# Patient Record
Sex: Male | Born: 1960 | Race: Black or African American | Hispanic: No | Marital: Married | Smoking: Former smoker
Health system: Southern US, Community
[De-identification: ages and names within clinical notes are randomized; demographics above are authoritative.]

## PROBLEM LIST (undated history)

## (undated) DIAGNOSIS — Z973 Presence of spectacles and contact lenses: Secondary | ICD-10-CM

## (undated) DIAGNOSIS — E7800 Pure hypercholesterolemia, unspecified: Secondary | ICD-10-CM

## (undated) DIAGNOSIS — I10 Essential (primary) hypertension: Secondary | ICD-10-CM

## (undated) DIAGNOSIS — S83249A Other tear of medial meniscus, current injury, unspecified knee, initial encounter: Secondary | ICD-10-CM

## (undated) DIAGNOSIS — M1990 Unspecified osteoarthritis, unspecified site: Secondary | ICD-10-CM

## (undated) DIAGNOSIS — T8859XA Other complications of anesthesia, initial encounter: Secondary | ICD-10-CM

## (undated) DIAGNOSIS — T4145XA Adverse effect of unspecified anesthetic, initial encounter: Secondary | ICD-10-CM

## (undated) DIAGNOSIS — G4730 Sleep apnea, unspecified: Secondary | ICD-10-CM

## (undated) HISTORY — PX: COLONOSCOPY: SHX174

---

## 2002-01-30 ENCOUNTER — Ambulatory Visit (HOSPITAL_BASED_OUTPATIENT_CLINIC_OR_DEPARTMENT_OTHER): Admission: RE | Admit: 2002-01-30 | Discharge: 2002-01-30 | Payer: Self-pay | Admitting: Family Medicine

## 2004-11-03 HISTORY — PX: NASAL SEPTUM SURGERY: SHX37

## 2004-11-03 HISTORY — PX: ELBOW SURGERY: SHX618

## 2007-11-04 DIAGNOSIS — G4730 Sleep apnea, unspecified: Secondary | ICD-10-CM

## 2007-11-04 HISTORY — PX: SINUS EXPLORATION: SHX5214

## 2007-11-04 HISTORY — DX: Sleep apnea, unspecified: G47.30

## 2008-01-13 ENCOUNTER — Ambulatory Visit (HOSPITAL_BASED_OUTPATIENT_CLINIC_OR_DEPARTMENT_OTHER): Admission: RE | Admit: 2008-01-13 | Discharge: 2008-01-13 | Payer: Self-pay | Admitting: Otolaryngology

## 2008-01-16 ENCOUNTER — Ambulatory Visit: Payer: Self-pay | Admitting: Internal Medicine

## 2008-02-23 ENCOUNTER — Observation Stay (HOSPITAL_COMMUNITY): Admission: AD | Admit: 2008-02-23 | Discharge: 2008-02-25 | Payer: Self-pay | Admitting: Otolaryngology

## 2008-03-21 ENCOUNTER — Emergency Department (HOSPITAL_COMMUNITY): Admission: EM | Admit: 2008-03-21 | Discharge: 2008-03-22 | Payer: Self-pay | Admitting: Emergency Medicine

## 2008-03-21 ENCOUNTER — Ambulatory Visit (HOSPITAL_COMMUNITY): Admission: EM | Admit: 2008-03-21 | Discharge: 2008-03-21 | Payer: Self-pay | Admitting: Emergency Medicine

## 2008-03-22 ENCOUNTER — Emergency Department (HOSPITAL_COMMUNITY): Admission: EM | Admit: 2008-03-22 | Discharge: 2008-03-22 | Payer: Self-pay | Admitting: Emergency Medicine

## 2011-03-18 NOTE — Op Note (Signed)
NAME:  Gary Aguilar, Gary Aguilar NO.:  [id number]   MEDICAL RECORD NO.:  [id number]          PATIENT TYPE:  INP   LOCATION:  3304                         FACILITY:  [facility]   PHYSICIAN:  Suzanna Obey, M.D.       DATE OF BIRTH:  [birth year]   DATE OF PROCEDURE:  02/23/2008  DATE OF DISCHARGE:                               OPERATIVE REPORT   PREOPERATIVE DIAGNOSES:  1. Deviated septum.  2. Turbinate hypertrophy.   POSTOPERATIVE DIAGNOSES:  1. Deviated septum.  2. Turbinate hypertrophy.   SURGICAL PROCEDURE:  Septoplasty and submucous resection of the inferior  turbinates.   ANESTHESIA:  General.   ESTIMATED BLOOD LOSS:  Approximately 20 mL.   INDICATIONS:  This is a 50 year old with obstructive sleep apnea and has  nasal obstruction.  He is going to have his nasal obstruction fixed  because this is a refractory medical therapy and then proceed with  alternate treatment for the obstructive sleep apnea.  The patient was  warned of the risks and benefits and options were discussed.  All  questions were answered and consent was obtained.   OPERATION:  The patient was taken to the operating room and placed in  supine position.  After adequate general endotracheal tube anesthesia,  he was placed in supine position and prepped and draped in the usual  sterile manner.  Oxymetazoline pledgets were placed in the nose  bilaterally and the septum inferior turbinates were injected with 1%  lidocaine with 1:100,000 epinephrine.  Left hemitransfixion incision was  performed, raising the mucoperichondrial nostril flap.  The catheter was  divided about 1.5 cm portion of the caudal strut and the deviated  portion of the cartilage was removed with the Therapist, nutritional.  The  opposite flap was elevated and the Jansen-Middleton forceps were used to  remove the deviated portion of the bone.  The inferior spur was removed  with a 4-mm osteotome.  This corrected the septal deflection.  The  hemitransfixion incision was closed with interrupted 4-0 chromic and 4-0  plain gut quilting stitch placed at the septum.  Turbinates were  infractured.  Midline incision made with a 15-blade.  Mucosal flap  elevated superiorly and the inferior mucosa and bone were removed with  the turbinate scissors.  The edge was cauterized with suction cautery  and both flaps were laid back down over the lower surface and both  turbinates were outfractured.  There was good hemostasis.  Telfa roll  soaked in bacitracin were placed into the nose bilaterally and secured  with 3-0 nylon.  The patient was awakened and brought to the recovery  room in stable condition.  Counts were correct.          ______________________________  Suzanna Obey, M.D.    JB/MEDQ  D:  02/23/2008  T:  02/24/2008  Job:  [id number]

## 2011-03-18 NOTE — Procedures (Signed)
NAME:  Gary Aguilar, Gary Aguilar               ACCOUNT NO.:  [id number]   MEDICAL RECORD NO.:  [id number]          PATIENT TYPE:  OUT   LOCATION:  SLEEP CENTER                 FACILITY:  [facility]   PHYSICIAN:  Clinton D. Maple Hudson, MD, FCCP, FACPDATE OF BIRTH:  Sep 26, 1963   DATE OF STUDY:  01/13/2008                            NOCTURNAL POLYSOMNOGRAM   REFERRING PHYSICIAN:  Suzanna Obey, M.D.   INDICATION FOR STUDY:  Hypersomnia with sleep apnea.   EPWORTH SLEEPINESS SCORE:  9/24, BMI 31, weight 230 pounds, height 72  inches, neck 15 inches.   HOME MEDICATIONS:  Charted and reviewed.   Notation is made that he works a rotating shift and has difficulty  initiating sleep related to this change in sleep schedule.  He had a  previous sleep study 10 years ago with report not available but he  indicates this showed sleep apnea, snoring, and restless sleep.   SLEEP ARCHITECTURE:  Total sleep time 232.5 minutes with sleep  efficiency 57.8%.  Stage I was 33.8%, stage II 66.2%, stage III and REM  were absent.  Sleep latency 30.5 minutes.  Awake after sleep onset 137.5  minutes.  Arousal index 46.2 which is increased.  No bedtime medication  was taken, although he reports using Ambien at home.  Sleep architecture  was characterized by markedly frequent brief waking episodes throughout  the night alternating between stage I and stage II.   RESPIRATORY DATA:  Apnea/hypopnea index (AHI) 65.5 per hour indicating  severe obstructive sleep apnea/hypopnea syndrome.  A total of 254 events  were scored including 19 obstructive apneas and 235 hypopneas.  The  events were not positional.  CPAP titration was not initiated by the  technician who indicated insufficient sleep by sleep center protocol and  cut off time.   OXYGEN DATA:  Moderately loud snoring with oxygen desaturation to a  nadir of 82%.  Mean oxygen saturation through the study 93.1% on room  air.   CARDIAC DATA:  Normal sinus rhythm.   MOVEMENT-PARASOMNIA:  No significant movement disturbance.  Bathroom x1.   IMPRESSIONS-RECOMMENDATIONS:  1. Severe obstructive sleep apnea/hypopnea syndrome, apnea/hypopnea      index 65.5 per hour with non-positional events, moderately loud      snoring, and oxygen desaturation to a nadir of 82%.  2. Sleep architecture was markedly fragmented.  The patient was not      able to accumulate enough sleep time by protocol to permit      confirmation of diagnosis by standard criteria with sufficient      remaining time for initiation of CPAP titration by protocol.  On      review it seems likely that much of this fragmentation reflected      sleep      apnea, although the patient's history of sleep disturbance related      to rotating shift work is noted.  Consider return for CPAP      titration or evaluate for alternative therapies as appropriate.      Clinton D. Maple Hudson, MD, FCCP, FACP  Diplomate, Biomedical engineer of Sleep Medicine  Electronically Signed     CDY/MEDQ  D:  01/16/2008 11:16:10  T:  01/16/2008 14:03:57  Job:  [id number]

## 2011-03-18 NOTE — Op Note (Signed)
NAME:  Gary Aguilar, Gary Aguilar NO.:  [id number]   MEDICAL RECORD NO.:  [id number]          PATIENT TYPE:  EMS   LOCATION:  MAJO                         FACILITY:  [facility]   PHYSICIAN:  Kinnie Scales. Annalee Genta, M.D.DATE OF BIRTH:  [birth year]   DATE OF PROCEDURE:  03/21/2008  DATE OF DISCHARGE:                               OPERATIVE REPORT   PREOPERATIVE DIAGNOSIS:  1. Left recurrent epistaxis.  2. Status post septoplasty and inferior turbinate reduction (Dr. Suzanna Obey, February 23, 2008).  3. Obstructive sleep apnea.  4. Hypertension.   POSTOPERATIVE DIAGNOSIS:  1. Left recurrent epistaxis.  2. Status post septoplasty and inferior turbinate reduction (Dr. Suzanna Obey, February 23, 2008).  3. Obstructive sleep apnea.  4. Hypertension.   INDICATIONS FOR SURGERY:  1. Left recurrent epistaxis.  2. Status post septoplasty and inferior turbinate reduction (Dr. Suzanna Obey, February 23, 2008).  3. Obstructive sleep apnea.  4. Hypertension.   SURGICAL PROCEDURES:  Endoscopic nasal examination and cautery of left  epistaxis.   ANESTHESIA:  General endotracheal.   SURGEON:  Kinnie Scales. Annalee Genta, MD   COMPLICATIONS:  None.   ESTIMATED BLOOD LOSS:  Approximately 50 mL.   The patient transferred from the operating room to the recovery room in  stable condition.   FINDINGS:  Significant bleeding along the left posterior inferior  turbinate with arterial bleeder along the posterior aspect of the  inferior turbinate.   BRIEF HISTORY:  [name] is 50 year old black male who underwent nasal  septoplasty and turbinate reduction by Dr. Suzanna Obey on February 23, 2008.  The patient was stable and doing well after surgery.  He returned to  work an approximately 5 days ago, began to develop severe recurrent left-  sided epistaxis.  He had been seen twice in the office and had undergone  silver nitrate cautery for bleeding along the left inferior turbinate.  The patient called  on an emergency basis on the morning of Mar 21, 2008,  with recurrent epistaxis less than 24 hours after his previous  evaluation and cautery.  Given the significant nature of the patient's  bleeding history, I recommended that he come to [facility] for  evaluation and surgical management of his recurrent epistaxis.  The  risks, benefits, and possible complications of endoscopic examination  and cautery of epistaxis were discussed in detail with the patient and  his wife and they understood and concurred with our plan for surgery.  Prior to surgery, the patient's situation was discussed with Dr. Jearld Fenton  and all parties concurred with our plan.   SURGICAL PROCEDURE:  The patient was brought to the operating room on  Mar 21, 2008, and placed in a supine position on the operating table.  General endotracheal anesthesia was established without difficulty.  When the patient was adequately anesthetized, his nose was packed with  Afrin-soaked cottonoid pledgets, which was left in place for  approximately 10 minutes after vasoconstriction and anesthesia.  The  patient was then positioned on the operating table,  prepped, and draped  in a sterile fashion.  Packing was removed and 0 degree nasal endoscopy  was performed.  The patient had heavy crusting over the entire inferior  aspect of the inferior turbinate.  This was gently debrided using  forceps and there was a significant amount of bleeding along the free  edge of the inferior turbinate extending all the way to the posterior  aspect of the turbinate where there was a significant arterial vessel.  Under endoscopic examination, the areas of epistaxis were cauterized  with suction cautery, beginning posteriorly and coming more anterior.  Hemostasis was obtained and there was no further bleeding.  The  patient's nasal cavity was irrigated.  The right side was inspected.  No  source of bleeding.  Minimal crusting on the turbinate.  The  patient's  left nasal cavity was then packed.  The cautery site along the inferior  turbinate was treated with Bactroban ointment.  A small amount of  Surgicel was then placed over the entire turbinate and a 6-cm Merocel  sponge was inserted in the left nasal cavity after the application of  Bactroban cream and hydrated with sterile saline solution.  An  orogastric tube was passed.  The stomach contents were aspirated.  Oral  cavity was inspected and suctioned.  There was no bleeding.  No  swelling.  The patient was then awakened from his anesthetic, extubated,  and transferred from the operating room to recovery room in stable  condition.  There were no complications.  Blood loss was less than 50  mL.           ______________________________  Kinnie Scales. Annalee Genta, M.D.     DLS/MEDQ  D:  04/54/0981  T:  03/22/2008  Job:  [id number]

## 2011-07-29 LAB — BASIC METABOLIC PANEL
BUN: 11
Calcium: 9.6
Creatinine, Ser: 1.1
GFR calc non Af Amer: >60
Glucose, Bld: 97

## 2011-07-29 LAB — CBC
HCT: 44.6
Platelets: 171
RDW: 13.4
WBC: 5.4

## 2011-07-30 LAB — POCT I-STAT 4, (NA,K, GLUC, HGB,HCT)
Glucose, Bld: 94
Operator id: 147011
Potassium: 5.4 — ABNORMAL HIGH

## 2013-03-03 ENCOUNTER — Other Ambulatory Visit: Payer: Self-pay | Admitting: Family Medicine

## 2013-03-03 ENCOUNTER — Ambulatory Visit
Admission: RE | Admit: 2013-03-03 | Discharge: 2013-03-03 | Disposition: A | Discharge status: Home / Self Care | Payer: Managed Care, Other (non HMO) | Source: Ambulatory Visit | Attending: Family Medicine | Admitting: Family Medicine

## 2013-03-03 DIAGNOSIS — S60022A Contusion of left index finger without damage to nail, initial encounter: Secondary | ICD-10-CM

## 2013-08-28 ENCOUNTER — Emergency Department (HOSPITAL_COMMUNITY)
Admission: EM | Admit: 2013-08-28 | Discharge: 2013-08-28 | Disposition: A | Discharge status: Home / Self Care | Payer: Managed Care, Other (non HMO) | Attending: Emergency Medicine | Admitting: Emergency Medicine

## 2013-08-28 ENCOUNTER — Encounter (HOSPITAL_COMMUNITY): Payer: Self-pay | Admitting: Emergency Medicine

## 2013-08-28 DIAGNOSIS — E785 Hyperlipidemia, unspecified: Secondary | ICD-10-CM | POA: Insufficient documentation

## 2013-08-28 DIAGNOSIS — R42 Dizziness and giddiness: Secondary | ICD-10-CM | POA: Insufficient documentation

## 2013-08-28 DIAGNOSIS — E7800 Pure hypercholesterolemia, unspecified: Secondary | ICD-10-CM | POA: Insufficient documentation

## 2013-08-28 DIAGNOSIS — I10 Essential (primary) hypertension: Secondary | ICD-10-CM | POA: Insufficient documentation

## 2013-08-28 DIAGNOSIS — Z79899 Other long term (current) drug therapy: Secondary | ICD-10-CM | POA: Insufficient documentation

## 2013-08-28 DIAGNOSIS — Z87891 Personal history of nicotine dependence: Secondary | ICD-10-CM | POA: Insufficient documentation

## 2013-08-28 HISTORY — DX: Pure hypercholesterolemia, unspecified: E78.00

## 2013-08-28 HISTORY — DX: Essential (primary) hypertension: I10

## 2013-08-28 LAB — CBC WITH DIFFERENTIAL/PLATELET
Basophils Absolute: 0 10*3/uL (ref 0.0–0.1)
Eosinophils Absolute: 0.1 10*3/uL (ref 0.0–0.7)
Eosinophils Relative: 1 % (ref 0–5)
HCT: 46.2 % (ref 39.0–52.0)
Lymphocytes Relative: 47 % — ABNORMAL HIGH (ref 12–46)
Lymphs Abs: 2.6 10*3/uL (ref 0.7–4.0)
MCH: 27.7 pg (ref 26.0–34.0)
MCV: 82.1 fL (ref 78.0–100.0)
Monocytes Absolute: 0.5 10*3/uL (ref 0.1–1.0)
RDW: 13.8 % (ref 11.5–15.5)
WBC: 5.5 10*3/uL (ref 4.0–10.5)

## 2013-08-28 LAB — COMPREHENSIVE METABOLIC PANEL
CO2: 26 mEq/L (ref 19–32)
Calcium: 10 mg/dL (ref 8.4–10.5)
Creatinine, Ser: 0.63 mg/dL (ref 0.50–1.35)
GFR calc Af Amer: >90 mL/min (ref >90)
GFR calc non Af Amer: >90 mL/min (ref >90)
Glucose, Bld: 117 mg/dL — ABNORMAL HIGH (ref 70–99)
Total Protein: 8 g/dL (ref 6.0–8.3)

## 2013-08-28 LAB — POCT I-STAT TROPONIN I

## 2013-08-28 MED ORDER — ONDANSETRON HCL 4 MG PO TABS: 4.0000 mg | ORAL_TABLET | Freq: Four times a day (QID) | ORAL | Status: DC

## 2013-08-28 MED ORDER — DIAZEPAM 5 MG PO TABS: 5.0000 mg | ORAL_TABLET | Freq: Three times a day (TID) | ORAL | Status: DC | PRN

## 2013-08-28 MED ORDER — POTASSIUM CHLORIDE 20 MEQ/15ML (10%) PO LIQD
40.0000 meq | Freq: Once | ORAL | Status: AC
  Administered 2013-08-28: 40 meq via ORAL
  Filled 2013-08-28: qty 30

## 2013-08-28 MED ORDER — MECLIZINE HCL 25 MG PO TABS
50.0000 mg | ORAL_TABLET | Freq: Once | ORAL | Status: AC
  Administered 2013-08-28: 50 mg via ORAL
  Filled 2013-08-28: qty 2

## 2013-08-28 MED ORDER — MECLIZINE HCL 25 MG PO TABS: 25.0000 mg | ORAL_TABLET | Freq: Three times a day (TID) | ORAL | Status: DC | PRN

## 2013-08-28 MED ORDER — ONDANSETRON HCL 4 MG/2ML IJ SOLN
4.0000 mg | Freq: Once | INTRAMUSCULAR | Status: AC
  Administered 2013-08-28: 4 mg via INTRAVENOUS
  Filled 2013-08-28: qty 2

## 2013-08-28 MED ORDER — DIAZEPAM 5 MG/ML IJ SOLN
5.0000 mg | Freq: Once | INTRAMUSCULAR | Status: AC
  Administered 2013-08-28: 5 mg via INTRAVENOUS
  Filled 2013-08-28: qty 2

## 2013-08-28 MED ORDER — SODIUM CHLORIDE 0.9 % IV BOLUS (SEPSIS)
1000.0000 mL | Freq: Once | INTRAVENOUS | Status: AC
  Administered 2013-08-28: 1000 mL via INTRAVENOUS

## 2013-08-28 NOTE — ED Notes (Signed)
Pt states he felt dizzy earlier today.  He and his wife had breakfast.  Pt states shortly before arriving to ED he felt nauseated and on arrival to ED, began to vomit.  Pt states he is also dizzy and it is worse when he turns his head to the right.  Pt states opening his eyes also makes him dizzy.  Pt denies chest pain and SOB.  Pt denies pain anywhere and states, "I just don't feel good."  Pt has good peripheral pulses in 4 extremities.  Breath sounds are clear and bowel sounds are present.  Pt placed on cardiac monitor.

## 2013-08-28 NOTE — ED Provider Notes (Signed)
TIME SEEN: 1:00 PM  CHIEF COMPLAINT: Vertigo, vomiting  HPI: Patient is a 52 year old male with a history of hypertension and hyperlipidemia presents emergency department with sudden onset of vertigo that is worse with turning his head to the left that started approximately one hour prior to arrival. He has had nausea and vomiting due to the vertigo. Denies any hearing loss, ear pain, tinnitus. No recent head injury. No headache. No numbness, tingling or focal weakness. No prior history of stroke, intracranial hemorrhage, TIA. No recent fever, cough, chest pain or shortness of breath, diarrhea.  ROS: See HPI Constitutional: no fever  Eyes: no drainage  ENT: no runny nose   Cardiovascular:  no chest pain  Resp: no SOB  GI: no vomiting GU: no dysuria Integumentary: no rash  Allergy: no hives  Musculoskeletal: no leg swelling  Neurological: no slurred speech ROS otherwise negative  PAST MEDICAL HISTORY/PAST SURGICAL HISTORY:  Past Medical History  Diagnosis Date  . Hypertension   . Hypercholesteremia     MEDICATIONS:  Prior to Admission medications   Medication Sig Start Date End Date Taking? Authorizing Provider  ezetimibe-simvastatin (VYTORIN) 10-40 MG per tablet Take 1 tablet by mouth at bedtime.   Yes Historical Provider, MD  hydrochlorothiazide (HYDRODIURIL) 25 MG tablet Take 25 mg by mouth daily.   Yes Historical Provider, MD  zolpidem (AMBIEN) 10 MG tablet Take 10 mg by mouth at bedtime as needed for sleep.   Yes Historical Provider, MD    ALLERGIES:  Allergies  Allergen Reactions  . Ace Inhibitors Swelling    SOCIAL HISTORY:  History  Substance Use Topics  . Smoking status: Former Games developer  . Smokeless tobacco: Current User    Types: Chew  . Alcohol Use: Yes     Comment: occasionally to daily (beer)    FAMILY HISTORY: No family history on file.  EXAM: BP 168/96  Pulse 66  Temp(Src) 99 F (37.2 C) (Oral)  Resp 20  SpO2 95% CONSTITUTIONAL: Alert and  oriented and responds appropriately to questions. Appears uncomfortable, no apparent distress HEAD: Normocephalic EYES: Conjunctivae clear, PERRL ENT: normal nose; no rhinorrhea; moist mucous membranes; pharynx without lesions noted, TMs are clear bilaterally; Dix-Hallpike on the left NECK: Supple, no meningismus, no LAD  CARD: RRR; S1 and S2 appreciated; no murmurs, no clicks, no rubs, no gallops RESP: Normal chest excursion without splinting or tachypnea; breath sounds clear and equal bilaterally; no wheezes, no rhonchi, no rales,  ABD/GI: Normal bowel sounds; non-distended; soft, non-tender, no rebound, no guarding BACK:  The back appears normal and is non-tender to palpation, there is no CVA tenderness EXT: Normal ROM in all joints; non-tender to palpation; no edema; normal capillary refill; no cyanosis    SKIN: Normal color for age and race; warm NEURO: Moves all extremities equally, no pronator drift, sensation to light touch intact diffusely, cranial nerves II through XII intact PSYCH: The patient's mood and manner are appropriate. Grooming and personal hygiene are appropriate.  MEDICAL DECISION MAKING: Patient here with vertigo but I suspect his progress for all in nature. It is worse with turning his head to the left. Patient is hemodynamically stable and has no other neurologic deficits. No headache or history of head injury. Will give IV fluids and Valium, Zofran for symptom relief. Will attempt Epley's maneuver when pt symptoms more controlled as I suspect patient has BPPV.  Low concern for central cause of vertigo.  ED PROGRESS: Pt labs unremarkable other than mild hypokalemia.  Will replace.  Still symptomatic after IVF and Valium.  Will give Meclizine.   Patient reports feeling somewhat better after meclizine. I performed an Epley's maneuver without relief. He does have fatigable horizontal nystagmus when turning his head to the left. Patient is able to stand and take several steps  but becomes very nauseous quickly. I have offered him admission but he reports he would like to go home. Have given strict return precautions. We'll have him followup with his primary care physician. We'll discharge with prescription for Valium, meclizine and Zofran. Have instructed him not to take meclizine and Valium at the same time that he may alternate between the 2. Patient and wife at bedside verbalize understanding and are comfortable with plan.    EKG Interpretation     Ventricular Rate:  62 PR Interval:  155 QRS Duration: 82 QT Interval:  464 QTC Calculation: 471 R Axis:   46 Text Interpretation:  Sinus rhythm RSR' in V1 or V2, right VCD or RVH Left ventricular hypertrophy J point elevation consistent with early repolarization             Kristen N Ward, DO 08/28/13 1548

## 2013-08-28 NOTE — ED Notes (Addendum)
Pt presents from home with a family member.  Pt states 20 - 30 minutes PTA he began having extreme nausea that culminated in vomiting on arrival to ED.  Pt denies pain except in neck, but states that pain has been present for 2 months.  Pt denies numbness/tingling and loss of bowel or bladder control.  Pt stated the nausea was preceded by dizziness.

## 2013-08-28 NOTE — ED Notes (Signed)
Pt continues to have dizziness following value admin.  Meclizine just given.

## 2013-11-06 IMAGING — CR DG FINGER INDEX 2+V*L*
3 series · 3 of 3 positions shown · non-contrast
Comparison: None.

Fell, pain

LEFT INDEX FINGER 2+V

[view not recorded (1 of 3)]
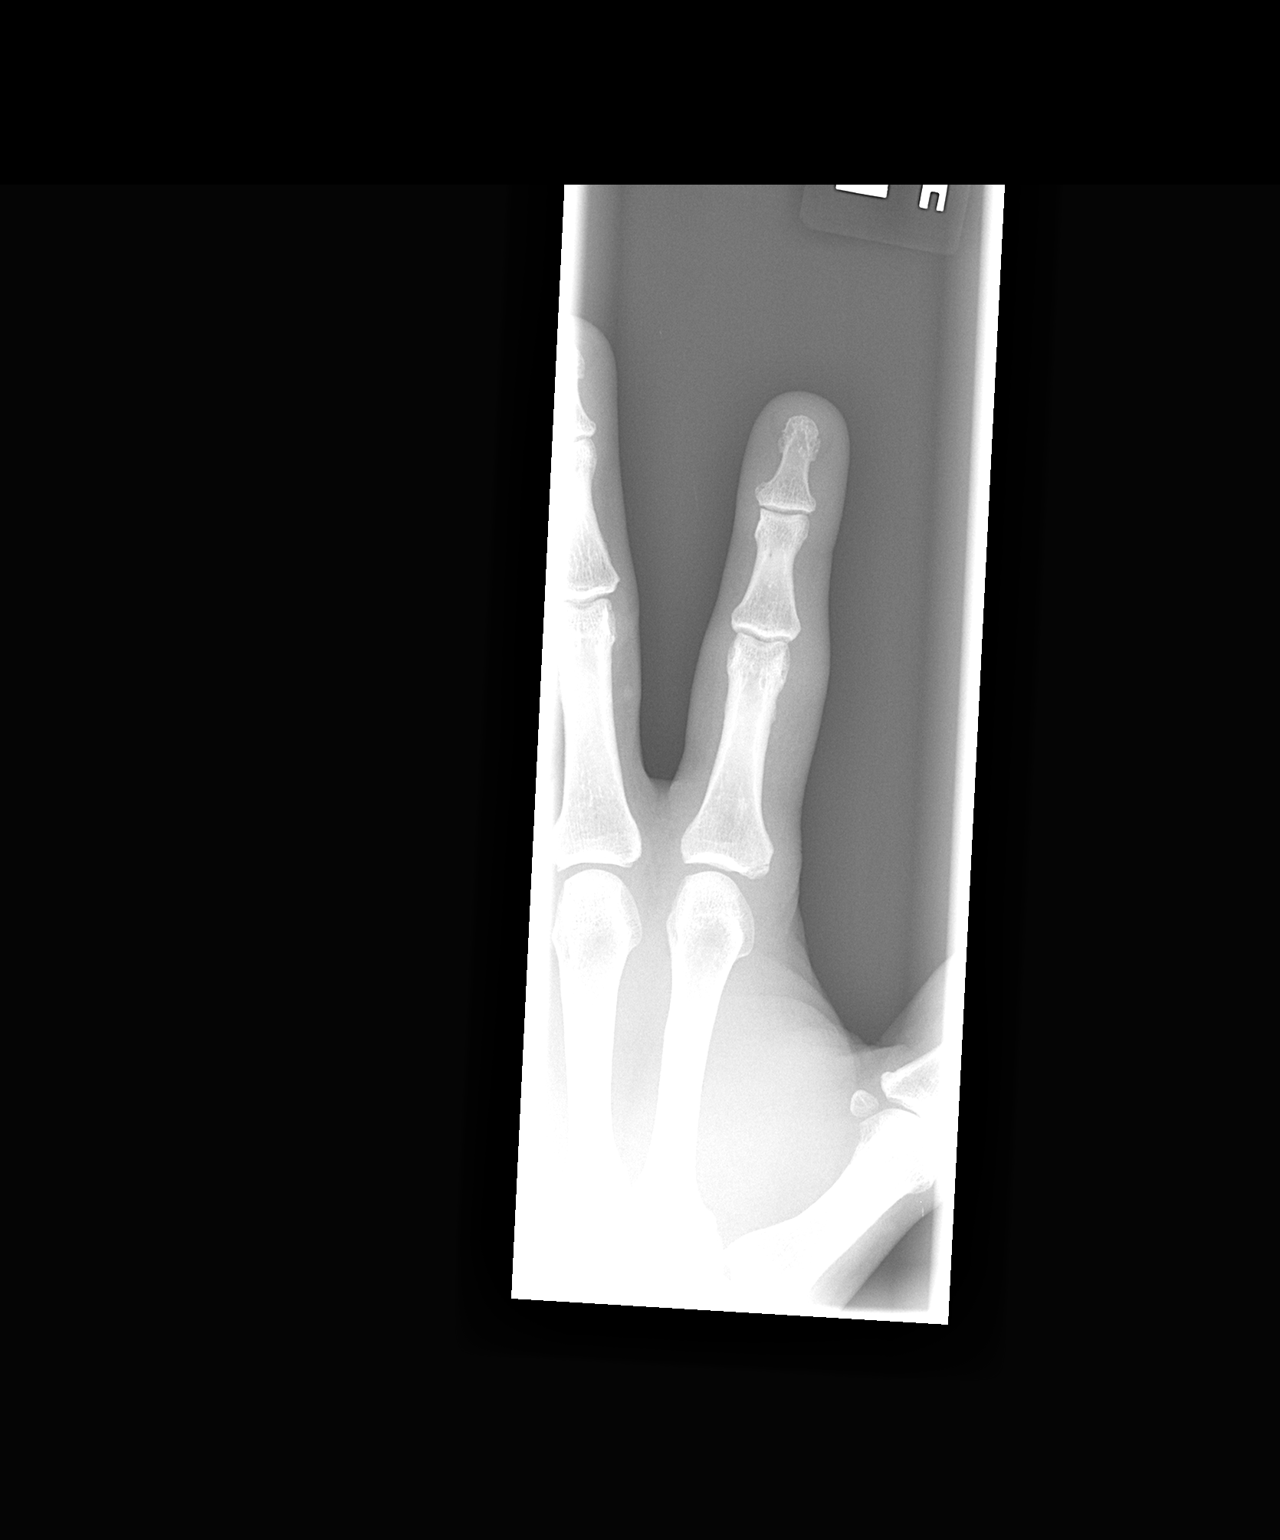

[view not recorded (2 of 3)]
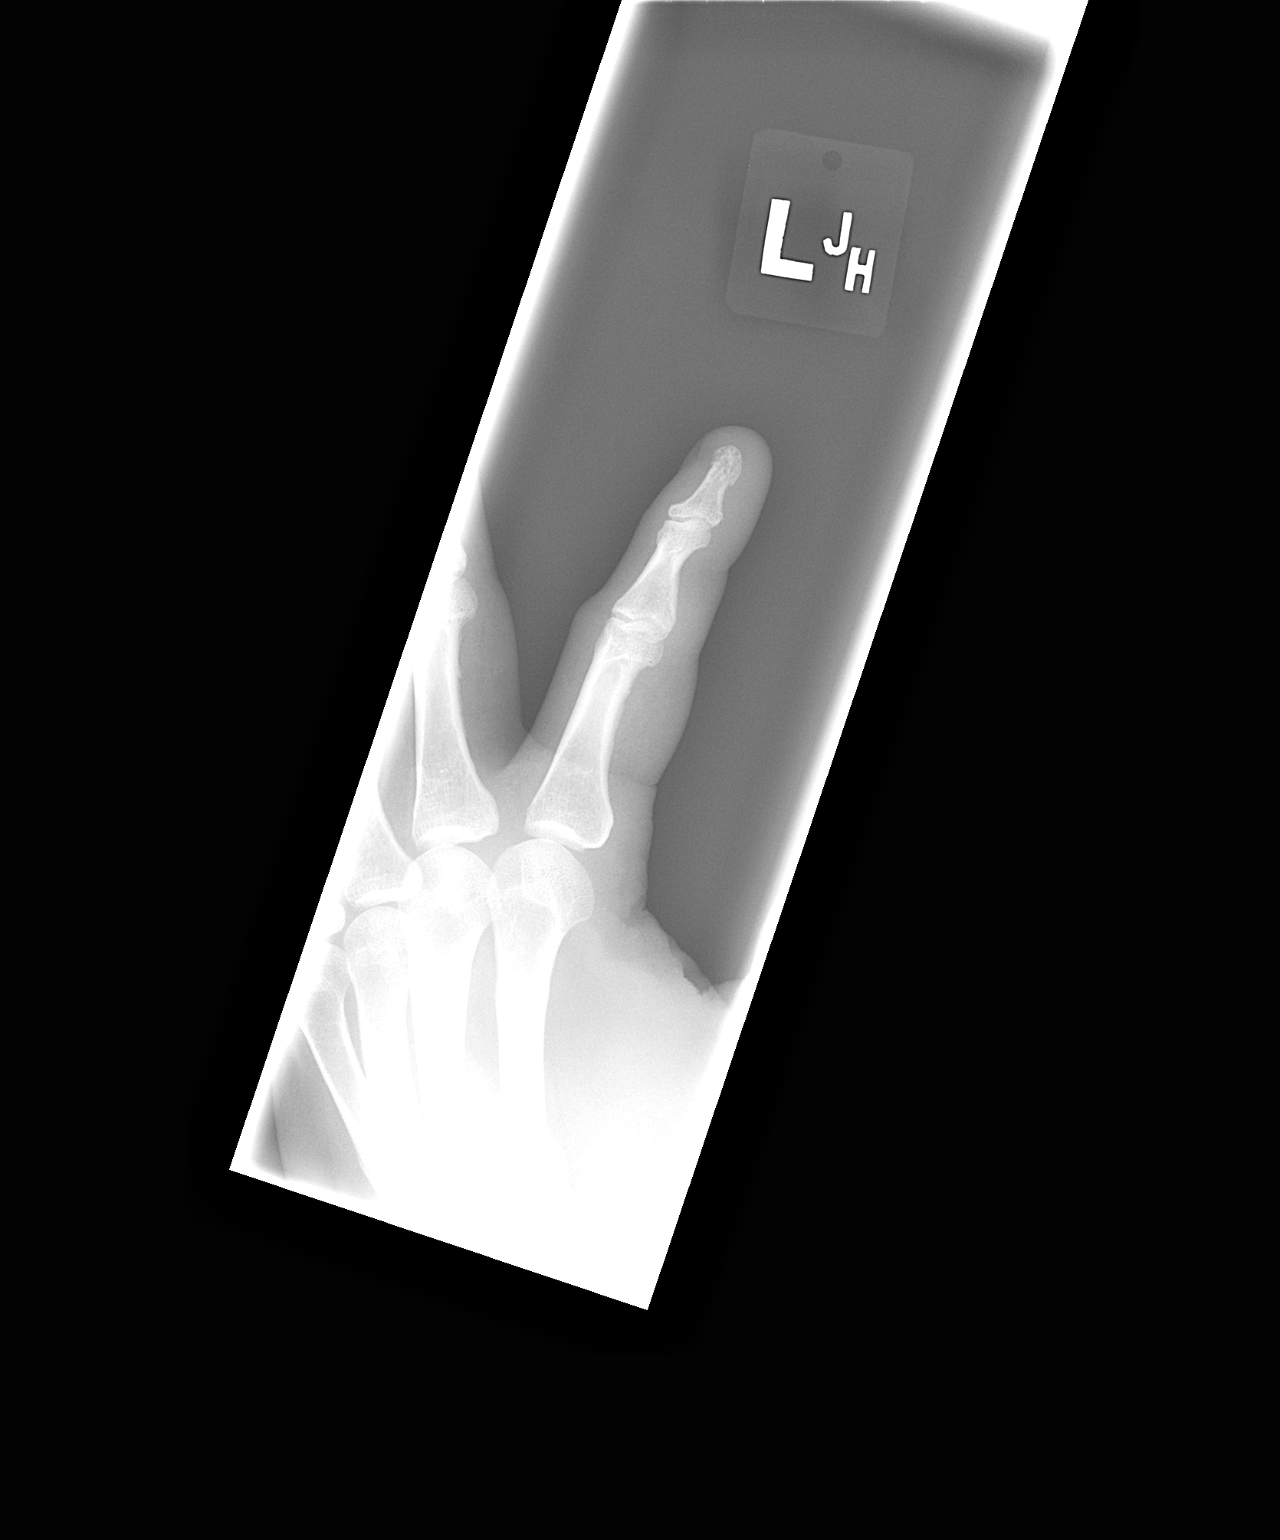

[view not recorded (3 of 3)]
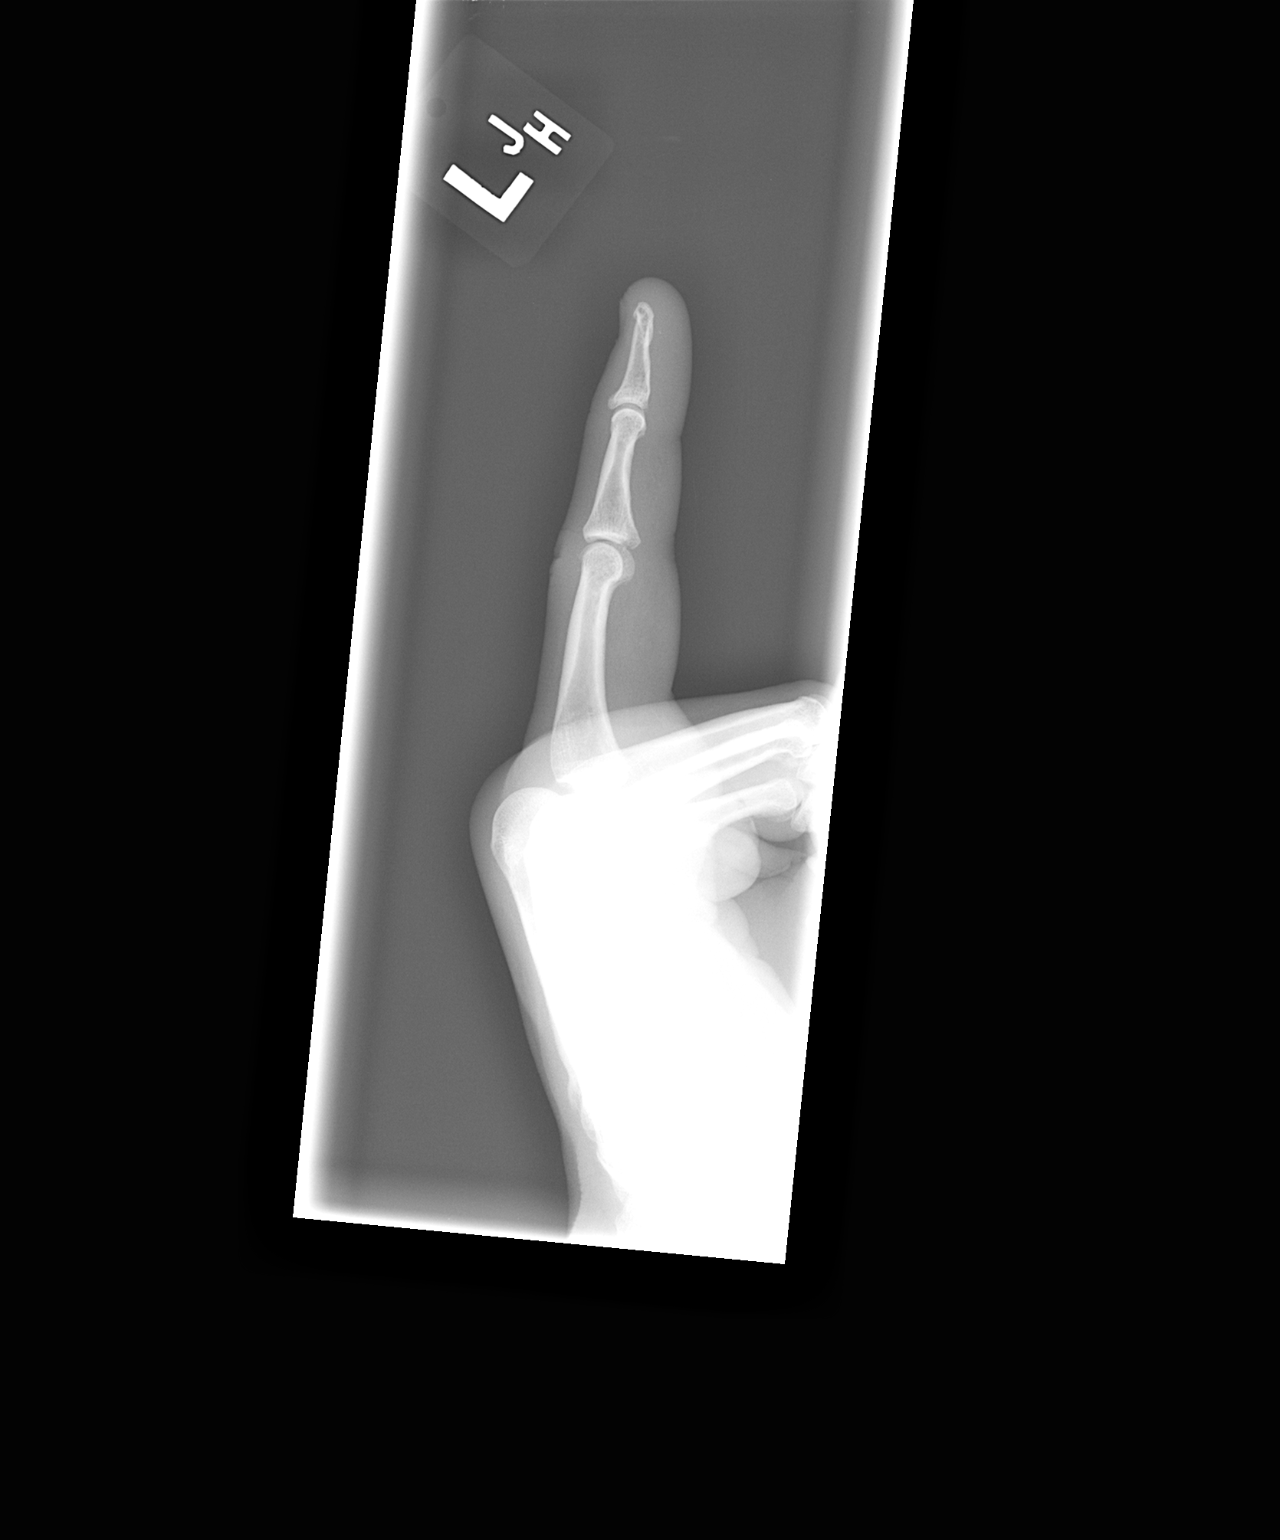

[3 of 3 positions shown; findings below may reference images not displayed]

FINDINGS: Suspect nondisplaced fracture radial aspect proximal
phalanx at the MCP joint (arrow).  Soft tissue swelling of the
index finger, particularly  proximal phalanx segment.

On the lateral view, there is a suspected nondisplaced chip
fracture of the volar aspect middle phalanx at the PIP joint
(arrow).
IMPRESSION: Suspected proximal and middle phalanges fractures as described.

## 2014-02-14 ENCOUNTER — Other Ambulatory Visit: Payer: Self-pay | Admitting: Orthopedic Surgery

## 2014-02-20 ENCOUNTER — Encounter (HOSPITAL_BASED_OUTPATIENT_CLINIC_OR_DEPARTMENT_OTHER): Payer: Self-pay | Admitting: *Deleted

## 2014-02-20 NOTE — Progress Notes (Signed)
Had ekg-10/14-recent labs pcp-to bring all meds and overnight bag and cpap

## 2014-02-22 ENCOUNTER — Encounter (HOSPITAL_BASED_OUTPATIENT_CLINIC_OR_DEPARTMENT_OTHER): Payer: Self-pay | Admitting: *Deleted

## 2014-02-22 NOTE — H&P (Signed)
Gary SalvoCalvin D Aguilar is an 53 y.o. male.   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 He is referred by Dairl PonderMatthew Weingold, MD for evaluation of a right shoulder pain predicament. This has been present for 4 months. It is much worse at night. He awakened one morning with significant shoulder pain and subsequently noted weakness of abduction and flexion. He has had tension in his trapezius muscle on the right and tightness of his neck with rotation right and left. He can forward flex and extend without discomfort. He has no numbness.     Past Medical History  Diagnosis Date  . Hypertension   . Hypercholesteremia   . Wears glasses   . Sleep apnea 2009    severe osa-uses cpap-  . Arthritis     Past Surgical History  Procedure Laterality Date  . Nasal septum surgery  2006    septoplasty-turb reduction  . Elbow surgery Left 2006    tendon repair  . Colonoscopy    . Sinus exploration  2009    epitaxis-caudry-lt     History reviewed. No pertinent family history. Social History:  reports that he has quit smoking. His smokeless tobacco use includes Chew. He reports that he drinks alcohol. He reports that he does not use illicit drugs.  Allergies:  Allergies  Allergen Reactions  . Ace Inhibitors Swelling    No prescriptions prior to admission    No results found for this or any previous visit (from the past 48 hour(s)).  No results found.   Pertinent items are noted in HPI.  Height 6' (1.829 m), weight 111.131 kg (245 lb).  General appearance: alert Head: Normocephalic, without obvious abnormality Neck: supple, symmetrical, trachea midline Resp: clear to auscultation bilaterally Cardio: regular rate and rhythm GI: normal findings: bowel sounds normal Extremities: . His shoulder ROM reveals combined elevation right shoulder 170, left shoulder 170, external rotation at 90  degrees abduction 85 right and 90 left, internal rotation T12 right and T12 left. He has full strength of internal/external rotation at neutral and abduction external rotation he is weak with pain and in pure scaption he is weak with pain. He has a painful Speeds test and O'Brien's test. He has marked pain with cross torso adduction and rapid abduction. His exam is compatible with impingement and possible small rotator cuff tear and possible internal derangement of the biceps tendon or capsular ligamentous/labral complex.   Plain films of the shoulder demonstrate a very substantial type III acromion with AC degenerative change.   After review of his plain films, he is also noted to have direct tenderness at the [facility] joint with firm palpation.   The MRI was accomplished at [facility] on 01-24-14.   The images were read by Dr. Christena Deemeibler who is a new radiologist reading for [facility]. Dr. Christena Deemeibler stated that the rotator cuff was intact but there was [facility] arthropathy. The images reveal a very prominent inferior osteophyte at the distal clavicle and marked reaction at the [facility] joint. There is also marked thinning of the rotator cuff with abnormal signal in distal 1.5 cm of the cuff suggestive of a PASTA and/or PITA tear. The cuff has thinned and may be primarily a bursal leader in several images.    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  Marveen ReeksRobert J Dasnoit 02/22/2014, 10:26 AM   H&P documentation: 02/23/2014  -History and Physical Reviewed  -Patient has been re-examined  -No change in the plan of care  Wyn Forsterobert V Sheritta Deeg Jr, MD

## 2014-02-23 ENCOUNTER — Ambulatory Visit (HOSPITAL_BASED_OUTPATIENT_CLINIC_OR_DEPARTMENT_OTHER): Payer: Managed Care, Other (non HMO) | Admitting: Certified Registered"

## 2014-02-23 ENCOUNTER — Encounter (HOSPITAL_BASED_OUTPATIENT_CLINIC_OR_DEPARTMENT_OTHER): Payer: Managed Care, Other (non HMO) | Admitting: Certified Registered"

## 2014-02-23 ENCOUNTER — Encounter (HOSPITAL_BASED_OUTPATIENT_CLINIC_OR_DEPARTMENT_OTHER)
Admission: RE | Disposition: A | Discharge status: Home / Self Care | Payer: Self-pay | Source: Ambulatory Visit | Attending: Orthopedic Surgery

## 2014-02-23 ENCOUNTER — Ambulatory Visit (HOSPITAL_BASED_OUTPATIENT_CLINIC_OR_DEPARTMENT_OTHER)
Admission: RE | Admit: 2014-02-23 | Discharge: 2014-02-24 | Disposition: A | Discharge status: Home / Self Care | Payer: Managed Care, Other (non HMO) | Source: Ambulatory Visit | Attending: Orthopedic Surgery | Admitting: Orthopedic Surgery

## 2014-02-23 ENCOUNTER — Encounter (HOSPITAL_BASED_OUTPATIENT_CLINIC_OR_DEPARTMENT_OTHER): Payer: Self-pay | Admitting: Certified Registered"

## 2014-02-23 DIAGNOSIS — E7800 Pure hypercholesterolemia, unspecified: Secondary | ICD-10-CM | POA: Insufficient documentation

## 2014-02-23 DIAGNOSIS — X58XXXA Exposure to other specified factors, initial encounter: Secondary | ICD-10-CM | POA: Insufficient documentation

## 2014-02-23 DIAGNOSIS — S43439A Superior glenoid labrum lesion of unspecified shoulder, initial encounter: Secondary | ICD-10-CM | POA: Insufficient documentation

## 2014-02-23 DIAGNOSIS — M67919 Unspecified disorder of synovium and tendon, unspecified shoulder: Secondary | ICD-10-CM | POA: Insufficient documentation

## 2014-02-23 DIAGNOSIS — M129 Arthropathy, unspecified: Secondary | ICD-10-CM | POA: Insufficient documentation

## 2014-02-23 DIAGNOSIS — I10 Essential (primary) hypertension: Secondary | ICD-10-CM | POA: Insufficient documentation

## 2014-02-23 DIAGNOSIS — M719 Bursopathy, unspecified: Secondary | ICD-10-CM | POA: Insufficient documentation

## 2014-02-23 DIAGNOSIS — M7580 Other shoulder lesions, unspecified shoulder: Secondary | ICD-10-CM

## 2014-02-23 DIAGNOSIS — M25819 Other specified joint disorders, unspecified shoulder: Secondary | ICD-10-CM | POA: Insufficient documentation

## 2014-02-23 DIAGNOSIS — G4733 Obstructive sleep apnea (adult) (pediatric): Secondary | ICD-10-CM | POA: Insufficient documentation

## 2014-02-23 DIAGNOSIS — M898X9 Other specified disorders of bone, unspecified site: Secondary | ICD-10-CM | POA: Insufficient documentation

## 2014-02-23 DIAGNOSIS — Z87891 Personal history of nicotine dependence: Secondary | ICD-10-CM | POA: Insufficient documentation

## 2014-02-23 DIAGNOSIS — M75120 Complete rotator cuff tear or rupture of unspecified shoulder, not specified as traumatic: Secondary | ICD-10-CM | POA: Diagnosis present

## 2014-02-23 HISTORY — DX: Unspecified osteoarthritis, unspecified site: M19.90

## 2014-02-23 HISTORY — PX: SHOULDER ARTHROSCOPY WITH ROTATOR CUFF REPAIR AND SUBACROMIAL DECOMPRESSION: SHX5686

## 2014-02-23 HISTORY — DX: Sleep apnea, unspecified: G47.30

## 2014-02-23 HISTORY — DX: Presence of spectacles and contact lenses: Z97.3

## 2014-02-23 LAB — POCT I-STAT, CHEM 8
BUN: 13 mg/dL (ref 6–23)
Calcium, Ion: 1.16 mmol/L (ref 1.12–1.23)
Chloride: 102 mEq/L (ref 96–112)
Creatinine, Ser: 1 mg/dL (ref 0.50–1.35)
GLUCOSE: 97 mg/dL (ref 70–99)
HCT: 46 % (ref 39.0–52.0)
HEMOGLOBIN: 15.6 g/dL (ref 13.0–17.0)
POTASSIUM: 3.3 meq/L — AB (ref 3.7–5.3)
Sodium: 142 mEq/L (ref 137–147)
TCO2: 26 mmol/L (ref 0–100)

## 2014-02-23 SURGERY — SHOULDER ARTHROSCOPY WITH ROTATOR CUFF REPAIR AND SUBACROMIAL DECOMPRESSION
Anesthesia: Regional | Site: Shoulder | Laterality: Right

## 2014-02-23 MED ORDER — FENTANYL CITRATE 0.05 MG/ML IJ SOLN
INTRAMUSCULAR | Status: DC | PRN
  Administered 2014-02-23 (×2): 50 ug via INTRAVENOUS

## 2014-02-23 MED ORDER — CEFAZOLIN SODIUM-DEXTROSE 2-3 GM-% IV SOLR
INTRAVENOUS | Status: AC
  Filled 2014-02-23: qty 50

## 2014-02-23 MED ORDER — CEFAZOLIN SODIUM-DEXTROSE 2-3 GM-% IV SOLR
2.0000 g | INTRAVENOUS | Status: AC
  Administered 2014-02-23: 2 g via INTRAVENOUS

## 2014-02-23 MED ORDER — ONDANSETRON HCL 4 MG/2ML IJ SOLN
INTRAMUSCULAR | Status: DC | PRN
Start: 2014-02-23 — End: 2014-02-23
  Administered 2014-02-23: 4 mg via INTRAVENOUS

## 2014-02-23 MED ORDER — OXYCODONE HCL 5 MG PO TABS: 5.0000 mg | ORAL_TABLET | Freq: Once | ORAL | Status: DC | PRN

## 2014-02-23 MED ORDER — CHLORHEXIDINE GLUCONATE 4 % EX LIQD: 60.0000 mL | Freq: Once | CUTANEOUS | Status: DC

## 2014-02-23 MED ORDER — METOPROLOL SUCCINATE ER 100 MG PO TB24: 100.0000 mg | ORAL_TABLET | Freq: Every day | ORAL | Status: DC

## 2014-02-23 MED ORDER — MIDAZOLAM HCL 2 MG/2ML IJ SOLN
1.0000 mg | INTRAMUSCULAR | Status: DC | PRN
  Administered 2014-02-23: 2 mg via INTRAVENOUS

## 2014-02-23 MED ORDER — OXYCODONE-ACETAMINOPHEN 5-325 MG PO TABS
1.0000 | ORAL_TABLET | ORAL | Status: DC | PRN
  Administered 2014-02-23 – 2014-02-24 (×2): 2 via ORAL
  Filled 2014-02-23 (×2): qty 2

## 2014-02-23 MED ORDER — ONDANSETRON HCL 4 MG/2ML IJ SOLN: 4.0000 mg | Freq: Once | INTRAMUSCULAR | Status: DC | PRN

## 2014-02-23 MED ORDER — METOCLOPRAMIDE HCL 5 MG PO TABS: 5.0000 mg | ORAL_TABLET | Freq: Three times a day (TID) | ORAL | Status: DC | PRN

## 2014-02-23 MED ORDER — HYDROMORPHONE HCL 2 MG PO TABS: ORAL_TABLET | ORAL | Status: DC

## 2014-02-23 MED ORDER — CEFAZOLIN SODIUM-DEXTROSE 2-3 GM-% IV SOLR
INTRAVENOUS | Status: AC
  Filled 2014-02-23: qty 100

## 2014-02-23 MED ORDER — PROPOFOL 10 MG/ML IV BOLUS
INTRAVENOUS | Status: DC | PRN
  Administered 2014-02-23: 300 mg via INTRAVENOUS
  Administered 2014-02-23: 20 mg via INTRAVENOUS

## 2014-02-23 MED ORDER — PROMETHAZINE HCL 25 MG/ML IJ SOLN
6.2500 mg | Freq: Once | INTRAMUSCULAR | Status: AC
  Administered 2014-02-23: 6.25 mg via INTRAVENOUS

## 2014-02-23 MED ORDER — LIDOCAINE HCL (CARDIAC) 20 MG/ML IV SOLN
INTRAVENOUS | Status: DC | PRN
  Administered 2014-02-23: 60 mg via INTRAVENOUS

## 2014-02-23 MED ORDER — ZOLPIDEM TARTRATE 10 MG PO TABS: 10.0000 mg | ORAL_TABLET | Freq: Every evening | ORAL | Status: DC | PRN

## 2014-02-23 MED ORDER — FENTANYL CITRATE 0.05 MG/ML IJ SOLN
INTRAMUSCULAR | Status: AC
  Filled 2014-02-23: qty 6

## 2014-02-23 MED ORDER — ONDANSETRON HCL 4 MG PO TABS
4.0000 mg | ORAL_TABLET | Freq: Four times a day (QID) | ORAL | Status: DC | PRN
Start: 2014-02-23 — End: 2014-02-24

## 2014-02-23 MED ORDER — PROMETHAZINE HCL 25 MG/ML IJ SOLN
INTRAMUSCULAR | Status: AC
  Filled 2014-02-23: qty 1

## 2014-02-23 MED ORDER — OXYCODONE HCL 5 MG/5ML PO SOLN: 5.0000 mg | Freq: Once | ORAL | Status: DC | PRN

## 2014-02-23 MED ORDER — PROPOFOL 10 MG/ML IV BOLUS
INTRAVENOUS | Status: AC
  Filled 2014-02-23: qty 20

## 2014-02-23 MED ORDER — FENTANYL CITRATE 0.05 MG/ML IJ SOLN
50.0000 ug | INTRAMUSCULAR | Status: DC | PRN
  Administered 2014-02-23: 100 ug via INTRAVENOUS

## 2014-02-23 MED ORDER — OXYCODONE-ACETAMINOPHEN 7.5-325 MG PO TABS: 1.0000 | ORAL_TABLET | ORAL | Status: DC | PRN

## 2014-02-23 MED ORDER — IBUPROFEN 600 MG PO TABS
600.0000 mg | ORAL_TABLET | Freq: Four times a day (QID) | ORAL | Status: DC | PRN
  Administered 2014-02-23: 600 mg via ORAL
  Filled 2014-02-23: qty 1

## 2014-02-23 MED ORDER — SUCCINYLCHOLINE CHLORIDE 20 MG/ML IJ SOLN
INTRAMUSCULAR | Status: DC | PRN
  Administered 2014-02-23: 100 mg via INTRAVENOUS

## 2014-02-23 MED ORDER — ONDANSETRON HCL 4 MG/2ML IJ SOLN: 4.0000 mg | Freq: Four times a day (QID) | INTRAMUSCULAR | Status: DC | PRN

## 2014-02-23 MED ORDER — MIDAZOLAM HCL 2 MG/2ML IJ SOLN
INTRAMUSCULAR | Status: AC
  Filled 2014-02-23: qty 2

## 2014-02-23 MED ORDER — CEPHALEXIN 500 MG PO CAPS: 500.0000 mg | ORAL_CAPSULE | Freq: Three times a day (TID) | ORAL | Status: DC

## 2014-02-23 MED ORDER — METHOCARBAMOL 500 MG PO TABS: 500.0000 mg | ORAL_TABLET | Freq: Four times a day (QID) | ORAL | Status: DC | PRN

## 2014-02-23 MED ORDER — LACTATED RINGERS IV SOLN
INTRAVENOUS | Status: DC
  Administered 2014-02-23 (×2): via INTRAVENOUS
  Administered 2014-02-23: 20 mL/h via INTRAVENOUS

## 2014-02-23 MED ORDER — METHOCARBAMOL 100 MG/ML IJ SOLN: 500.0000 mg | Freq: Four times a day (QID) | INTRAVENOUS | Status: DC | PRN

## 2014-02-23 MED ORDER — DEXAMETHASONE SODIUM PHOSPHATE 4 MG/ML IJ SOLN
INTRAMUSCULAR | Status: DC | PRN
  Administered 2014-02-23: 10 mg via INTRAVENOUS

## 2014-02-23 MED ORDER — CEFAZOLIN SODIUM-DEXTROSE 2-3 GM-% IV SOLR
2.0000 g | Freq: Four times a day (QID) | INTRAVENOUS | Status: AC
  Administered 2014-02-23 – 2014-02-24 (×3): 2 g via INTRAVENOUS

## 2014-02-23 MED ORDER — FENTANYL CITRATE 0.05 MG/ML IJ SOLN
INTRAMUSCULAR | Status: AC
  Filled 2014-02-23: qty 2

## 2014-02-23 MED ORDER — HYDROCHLOROTHIAZIDE 25 MG PO TABS: 25.0000 mg | ORAL_TABLET | Freq: Every day | ORAL | Status: DC

## 2014-02-23 MED ORDER — DEXTROSE-NACL 5-0.45 % IV SOLN
INTRAVENOUS | Status: DC
  Administered 2014-02-23: 18:00:00 via INTRAVENOUS

## 2014-02-23 MED ORDER — ATORVASTATIN CALCIUM 20 MG PO TABS: 20.0000 mg | ORAL_TABLET | Freq: Every day | ORAL | Status: DC

## 2014-02-23 MED ORDER — HYDROMORPHONE HCL PF 1 MG/ML IJ SOLN: 0.5000 mg | INTRAMUSCULAR | Status: DC | PRN

## 2014-02-23 MED ORDER — HYDROMORPHONE HCL PF 1 MG/ML IJ SOLN: 0.2500 mg | INTRAMUSCULAR | Status: DC | PRN

## 2014-02-23 MED ORDER — ROPIVACAINE HCL 5 MG/ML IJ SOLN
INTRAMUSCULAR | Status: DC | PRN
  Administered 2014-02-23: 30 mL via PERINEURAL

## 2014-02-23 MED ORDER — SODIUM CHLORIDE 0.9 % IR SOLN
Status: DC | PRN
  Administered 2014-02-23: 18000 mL

## 2014-02-23 MED ORDER — METOCLOPRAMIDE HCL 5 MG/ML IJ SOLN: 5.0000 mg | Freq: Three times a day (TID) | INTRAMUSCULAR | Status: DC | PRN

## 2014-02-23 SURGICAL SUPPLY — 80 items
ANCH SUT SWLK 19.1X4.75 VT (Anchor) ×1 IMPLANT
ANCHOR PEEK 4.75X19.1 SWLK C (Anchor) ×2 IMPLANT
BANDAGE ADH SHEER 1  50/CT (GAUZE/BANDAGES/DRESSINGS) IMPLANT
BLADE 15 SAFETY STRL DISP (BLADE) ×6 IMPLANT
BLADE AVERAGE 25MMX9MM (BLADE)
BLADE AVERAGE 25X9 (BLADE) IMPLANT
BLADE CUTTER MENIS 5.5 (BLADE) ×2 IMPLANT
BUR EGG 3PK/BX (BURR) IMPLANT
BUR OVAL 6.0 (BURR) ×3 IMPLANT
CANISTER SUCT 3000ML (MISCELLANEOUS) IMPLANT
CANNULA TWIST IN 8.25X7CM (CANNULA) ×4 IMPLANT
CLEANER CAUTERY TIP 5X5 PAD (MISCELLANEOUS) IMPLANT
CLOSURE WOUND 1/2 X4 (GAUZE/BANDAGES/DRESSINGS) ×1
CUTTER MENISCUS  4.2MM (BLADE) ×2
CUTTER MENISCUS 4.2MM (BLADE) ×1 IMPLANT
DECANTER SPIKE VIAL GLASS SM (MISCELLANEOUS) IMPLANT
DRAPE INCISE IOBAN 66X45 STRL (DRAPES) ×3 IMPLANT
DRAPE STERI 35X30 U-POUCH (DRAPES) ×3 IMPLANT
DRAPE SURG 17X23 STRL (DRAPES) ×3 IMPLANT
DRAPE U-SHAPE 47X51 STRL (DRAPES) ×3 IMPLANT
DRAPE U-SHAPE 76X120 STRL (DRAPES) ×6 IMPLANT
DRSG PAD ABDOMINAL 8X10 ST (GAUZE/BANDAGES/DRESSINGS) ×3 IMPLANT
DURAPREP 26ML APPLICATOR (WOUND CARE) ×3 IMPLANT
ELECT REM PT RETURN 9FT ADLT (ELECTROSURGICAL) ×3
ELECTRODE REM PT RTRN 9FT ADLT (ELECTROSURGICAL) IMPLANT
GLOVE BIO SURGEON STRL SZ 6.5 (GLOVE) ×1 IMPLANT
GLOVE BIO SURGEONS STRL SZ 6.5 (GLOVE) ×1
GLOVE BIOGEL M STRL SZ7.5 (GLOVE) ×3 IMPLANT
GLOVE BIOGEL PI IND STRL 7.0 (GLOVE) IMPLANT
GLOVE BIOGEL PI IND STRL 8 (GLOVE) ×2 IMPLANT
GLOVE BIOGEL PI INDICATOR 7.0 (GLOVE) ×2
GLOVE BIOGEL PI INDICATOR 8 (GLOVE) ×4
GLOVE ORTHO TXT STRL SZ7.5 (GLOVE) ×3 IMPLANT
GOWN STRL REIN 2XL XLG LVL4 (GOWN DISPOSABLE) ×3 IMPLANT
GOWN STRL REUS W/TWL XL LVL4 (GOWN DISPOSABLE) ×6 IMPLANT
MANIFOLD NEPTUNE II (INSTRUMENTS) ×3 IMPLANT
NDL SCORPION (NEEDLE) ×1 IMPLANT
NDL SUT 6 .5 CRC .975X.05 MAYO (NEEDLE) IMPLANT
NEEDLE MAYO TAPER (NEEDLE) ×3
NEEDLE MINI RC 24MM (NEEDLE) IMPLANT
NEEDLE SCORPION (NEEDLE) ×3 IMPLANT
PACK ARTHROSCOPY DSU (CUSTOM PROCEDURE TRAY) ×3 IMPLANT
PACK BASIN DAY SURGERY FS (CUSTOM PROCEDURE TRAY) ×3 IMPLANT
PAD CLEANER CAUTERY TIP 5X5 (MISCELLANEOUS)
PASSER SUT SWANSON 36MM LOOP (INSTRUMENTS) IMPLANT
PENCIL BUTTON HOLSTER BLD 10FT (ELECTRODE) IMPLANT
SLEEVE SCD COMPRESS KNEE MED (MISCELLANEOUS) ×3 IMPLANT
SLING ARM IMMOBILIZER LRG (SOFTGOODS) ×2 IMPLANT
SLING ARM LRG ADULT FOAM STRAP (SOFTGOODS) IMPLANT
SLING ARM MED ADULT FOAM STRAP (SOFTGOODS) IMPLANT
SPONGE GAUZE 4X4 12PLY (GAUZE/BANDAGES/DRESSINGS) ×3 IMPLANT
SPONGE LAP 4X18 X RAY DECT (DISPOSABLE) ×2 IMPLANT
STRIP CLOSURE SKIN 1/2X4 (GAUZE/BANDAGES/DRESSINGS) ×1 IMPLANT
SUCTION FRAZIER TIP 10 FR DISP (SUCTIONS) IMPLANT
SUT FIBERWIRE #2 38 T-5 BLUE (SUTURE)
SUT FIBERWIRE 3-0 18 TAPR NDL (SUTURE)
SUT PROLENE 1 CT (SUTURE) IMPLANT
SUT PROLENE 3 0 PS 2 (SUTURE) ×3 IMPLANT
SUT TIGER TAPE 7 IN WHITE (SUTURE) ×2 IMPLANT
SUT VIC AB 0 CT1 27 (SUTURE)
SUT VIC AB 0 CT1 27XBRD ANBCTR (SUTURE) IMPLANT
SUT VIC AB 0 SH 27 (SUTURE) IMPLANT
SUT VIC AB 2-0 SH 27 (SUTURE)
SUT VIC AB 2-0 SH 27XBRD (SUTURE) IMPLANT
SUT VIC AB 3-0 SH 27 (SUTURE)
SUT VIC AB 3-0 SH 27X BRD (SUTURE) IMPLANT
SUT VIC AB 3-0 X1 27 (SUTURE) IMPLANT
SUTURE FIBERWR #2 38 T-5 BLUE (SUTURE) IMPLANT
SUTURE FIBERWR 3-0 18 TAPR NDL (SUTURE) IMPLANT
SYR 3ML 23GX1 SAFETY (SYRINGE) IMPLANT
SYR BULB 3OZ (MISCELLANEOUS) IMPLANT
TAPE FIBER 2MM 7IN #2 BLUE (SUTURE) IMPLANT
TAPE PAPER 3X10 WHT MICROPORE (GAUZE/BANDAGES/DRESSINGS) ×3 IMPLANT
TOWEL OR 17X24 6PK STRL BLUE (TOWEL DISPOSABLE) ×3 IMPLANT
TUBE CONNECTING 20'X1/4 (TUBING) ×2
TUBE CONNECTING 20X1/4 (TUBING) ×3 IMPLANT
TUBING ARTHROSCOPY IRRIG 16FT (MISCELLANEOUS) ×3 IMPLANT
WAND STAR VAC 90 (SURGICAL WAND) ×3 IMPLANT
WATER STERILE IRR 1000ML POUR (IV SOLUTION) ×3 IMPLANT
YANKAUER SUCT BULB TIP NO VENT (SUCTIONS) IMPLANT

## 2014-02-23 NOTE — Transfer of Care (Signed)
Immediate Anesthesia Transfer of Care Note  Patient: Gary Aguilar  Procedure(s) Performed: Procedure(s): RIGHT SHOULDER ARTHROSCOPY SUBACROMIAL DECOMPRESSION DISTAL CLAVICLE RESECTION ROTATOR CUFF REPAIR  (Right)  Patient Location: PACU  Anesthesia Type:GA combined with regional for post-op pain  Level of Consciousness: awake, alert , oriented and patient cooperative  Airway & Oxygen Therapy: Patient Spontanous Breathing and Patient connected to face mask oxygen  Post-op Assessment: Report given to PACU RN, Post -op Vital signs reviewed and stable and Patient moving all extremities  Post vital signs: Reviewed and stable  Complications: No apparent anesthesia complications

## 2014-02-23 NOTE — Anesthesia Postprocedure Evaluation (Signed)
  Anesthesia Post-op Note  Patient: Gary Aguilar  Procedure(s) Performed: Procedure(s): RIGHT SHOULDER ARTHROSCOPY SUBACROMIAL DECOMPRESSION DISTAL CLAVICLE RESECTION ROTATOR CUFF REPAIR  (Right)  Patient Location: PACU  Anesthesia Type:GA combined with regional for post-op pain  Level of Consciousness: awake, alert  and oriented  Airway and Oxygen Therapy: Patient Spontanous Breathing  Post-op Pain: none  Post-op Assessment: Post-op Vital signs reviewed  Post-op Vital Signs: Reviewed  Last Vitals:  Filed Vitals:   02/23/14 1500  BP: 121/74  Pulse: 66  Temp:   Resp: 16    Complications: No apparent anesthesia complications

## 2014-02-23 NOTE — Discharge Instructions (Signed)

## 2014-02-23 NOTE — Brief Op Note (Signed)
02/23/2014  2:42 PM  PATIENT:  Gary Aguilar  53 y.o. 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  PROCEDURE:  Procedure(s): RIGHT SHOULDER ARTHROSCOPY SUBACROMIAL DECOMPRESSION DISTAL CLAVICLE RESECTION ROTATOR CUFF REPAIR  (Right)  SURGEON:  Surgeon(s) and Role:    * [name], MD - Primary  PHYSICIAN ASSISTANT:   ASSISTANTS: Mallory Shirkobert Dasnoit,P.A-C    ANESTHESIA:   general  EBL:  Total I/O In: 1600 [I.V.:1600] Out: -   BLOOD ADMINISTERED:none  DRAINS: none   LOCAL MEDICATIONS USED: Ropivacaine plexus block pre op  SPECIMEN:  No Specimen  DISPOSITION OF SPECIMEN:  N/A  COUNTS:  YES  TOURNIQUET:  * No tourniquets in log *  DICTATION: .Other Dictation: Dictation Number [id number]  PLAN OF CARE: Admit for overnight observation  PATIENT DISPOSITION:  PACU - hemodynamically stable.   Delay start of Pharmacological VTE agent (>24hrs) due to surgical blood loss or risk of bleeding: not applicable

## 2014-02-23 NOTE — Anesthesia Preprocedure Evaluation (Signed)
Anesthesia Evaluation  Patient identified by MRN, date of birth, ID band Patient awake    Reviewed: Allergy & Precautions, H&P , NPO status , Patient's Chart, lab work & pertinent test results  Airway Mallampati: I TM Distance: >3 FB Neck ROM: Full    Dental  (+) Teeth Intact, Dental Advisory Given   Pulmonary sleep apnea and Continuous Positive Airway Pressure Ventilation , former smoker,  breath sounds clear to auscultation        Cardiovascular hypertension, Pt. on medications Rhythm:Regular Rate:Normal     Neuro/Psych    GI/Hepatic   Endo/Other    Renal/GU      Musculoskeletal   Abdominal   Peds  Hematology   Anesthesia Other Findings   Reproductive/Obstetrics                           Anesthesia Physical Anesthesia Plan  ASA: II  Anesthesia Plan: General   Post-op Pain Management:    Induction: Intravenous  Airway Management Planned: Oral ETT  Additional Equipment:   Intra-op Plan:   Post-operative Plan: Extubation in OR  Informed Consent: I have reviewed the patients History and Physical, chart, labs and discussed the procedure including the risks, benefits and alternatives for the proposed anesthesia with the patient or authorized representative who has indicated his/her understanding and acceptance.   Dental advisory given  Plan Discussed with: CRNA and Anesthesiologist  Anesthesia Plan Comments:         Anesthesia Quick Evaluation

## 2014-02-23 NOTE — Progress Notes (Signed)
Assisted Dr. Crews with right, ultrasound guided, interscalene  block. Side rails up, monitors on throughout procedure. See vital signs in flow sheet. Tolerated Procedure well. 

## 2014-02-23 NOTE — Anesthesia Procedure Notes (Addendum)
Anesthesia Regional Block:  Interscalene brachial plexus block  Pre-Anesthetic Checklist: ,, timeout performed, Correct Patient, Correct Site, Correct Laterality, Correct Procedure, Correct Position, site marked, Risks and benefits discussed,  Surgical consent,  Pre-op evaluation,  At surgeon's request and post-op pain management  Laterality: Right and Upper  Prep: chloraprep       Needles:  Injection technique: Single-shot  Needle Type: Echogenic Needle     Needle Length: 5cm 5 cm Needle Gauge: 21 and 21 G    Additional Needles:  Procedures: ultrasound guided (picture in chart) Interscalene brachial plexus block Narrative:  Start time: 02/23/2014 11:00 AM End time: 02/23/2014 11:10 AM Injection made incrementally with aspirations every 5 mL.  Performed by: Personally  Anesthesiologist: Sheldon Silvanavid Crews, MD   Procedure Name: Intubation Date/Time: 02/23/2014 1:47 PM Performed by: Curly ShoresRAFT, Zhanae Proffit W Pre-anesthesia Checklist: Patient identified, Emergency Drugs available, Suction available and Patient being monitored Patient Re-evaluated:Patient Re-evaluated prior to inductionOxygen Delivery Method: Circle System Utilized Preoxygenation: Pre-oxygenation with 100% oxygen Intubation Type: IV induction Ventilation: Mask ventilation without difficulty Laryngoscope Size: Miller and 2 Grade View: Grade I Tube type: Oral Tube size: 8.0 mm Number of attempts: 1 Airway Equipment and Method: stylet Placement Confirmation: ETT inserted through vocal cords under direct vision,  positive ETCO2 and breath sounds checked- equal and bilateral Secured at: 23 cm Tube secured with: Tape Dental Injury: Teeth and Oropharynx as per pre-operative assessment

## 2014-02-24 ENCOUNTER — Encounter (HOSPITAL_BASED_OUTPATIENT_CLINIC_OR_DEPARTMENT_OTHER): Payer: Self-pay | Admitting: Orthopedic Surgery

## 2014-02-24 NOTE — Op Note (Signed)
[name], [name] NO.:  [id number]  MEDICAL RECORD NO.:  [id number]  LOCATION:                                 FACILITY:  PHYSICIAN:  Gary Aguilar, M.D. DATE OF BIRTH:  10-06-1961  DATE OF PROCEDURE:  02/23/2014 DATE OF DISCHARGE:                              OPERATIVE REPORT   PREOPERATIVE DIAGNOSES:  Chronic right shoulder pain with MRI revealing very unfavorable acromioclavicular anatomy with arthritis and large medial acromial osteophyte and inferior distal clavicle osteophyte, also chronic bursitis, abnormal signal in the supraspinatus and subscapularis rotator cuff tendons, and labral irregularity superiorly and anteriorly.  POSTOPERATIVE DIAGNOSES: 1. Grade 1 SLAP tear with extensive degenerative labral changes and a     stable biceps origin. 2. Grade 2 subscapularis upper rotator cuff tear with retraction from     lesser tuberosity. 3. Very prominent anterior and lateral acromial osteophyte causing     chronic grade 2 impingement with ragged supraspinatus and partial     infraspinatus tendons.  There was no sign of a retracted     supraspinatus, infraspinatus, or teres minor rotator cuff tear.  OPERATION: 1. Diagnostic arthroscopy, right glenohumeral joint with labral     debridement. 2. Arthroscopic repair of subscapularis grade 2 tear with a 4.75 mm     PEEK swivel lock, and a looped fiber tape suture recreating an     anatomic footprint of the subscapularis. 3. Arthroscopic subacromial bursectomy, relaxation of coracoacromial     ligament, and extensive acromioplasty including anterior and     lateral acromioplasty, and removal of the large medial osteophyte     at the [facility] joint. 4. Arthroscopic resection of distal clavicle.  OPERATING SURGEON:  Gary Mighty. Kei Langhorst, MD.  ASSISTANT:  Gary Aguilar, P.A.C.  ANESTHESIA:  General by endotracheal technique.  SUPERVISING ANESTHESIOLOGIST:  Gary Aguilar.  INDICATIONS:  Gary Aguilar is a  53 year old, right-hand dominant, [employer] who has had a history of chronic right shoulder pain.  He has recently completed reconstruction of a collateral ligament for his right index finger metacarpophalangeal joint by my partner, Gary Aguilar.  During his rehabilitation of his hand, Gary Aguilar noted significant right shoulder pain.  Gary Aguilar has some significant background medical problems including hypertension, obesity, and severe sleep apnea.  He has been thoroughly worked up by Gary Aguilar for sleep apnea and uses CPAP at home.  Gary Aguilar did not respond to simple therapy and injection of the subacromial space.  He was sent for an MRI of the shoulder which documented abnormal signal in the rotator cuff tendons, extremely unfavorable contour of his acromioclavicular joint with a large medial acromial osteophyte, and inferior projecting distal clavicle osteophyte. The appearance of his acromion is also quite unusual with a type 3 anterior and type 3 lateral acromial osteophyte.  Gary Aguilar appeared to be developing an early rotator cuff tear, therefore we recommended intervention at this time.  He failed nonoperative measures including supervised therapy and subacromial injection.  After informed consent, he was brought to the operating room at this time.  PROCEDURE:  Gary Aguilar was met in the holding area.  His proper surgical site was identified per protocol with a marking pen as the right shoulder.  He was interviewed by Gary Aguilar of Anesthesia and was advised to undergo general endotracheal anesthesia supplemented by perioperative ropivacaine plexus block.  Gary Aguilar placed a plexus block in the holding area with ultrasound guidance without complication.  This led to excellent anesthesia of the right upper extremity and forequarter.  Questions were invited and answered to Gary Aguilar and his family in the holding  area, followed by transfer to room [facility] of the [facility].  Under Gary Aguilar' direct supervision, general endotracheal anesthesia was induced followed by careful positioning in the beach-chair position with aid of a torso and head holder designed for shoulder arthroscopy.  Passive compression devices were applied to his calves and a warming blanket was applied.  Great care was taken to be sure he had no tight straps or clothing around his midsection while he is in the beach-chair position.  He was carefully positioned in the beach-chair position with aid of a torso and head holder designed for shoulder arthroscopy followed by routine prep of the left upper extremity and forequarter with DuraPrep. Sterile impervious arthroscopy drapes were applied including impervious stockinette.  The procedure commenced with a routine surgical time-out.  We ensured that 2 g of Ancef had been administered as an IV prophylactic antibiotic beginning in the holding area.  The shoulder was instrumented with the anterior switching stick technique with the scope being placed through a standard posterior viewing portal with blunt technique.  Diagnostic arthroscopy revealed a type 1 SLAP lesion and a grade 2 subscapularis tear.  The long head of the biceps had a stable origin at the superior glenoid.  It was tested with a nerve hook and found to be sound.  An anterior portal was created under direct vision, and a 4.2 mm suction shaver was used to debride the labrum to stable margin.  The subscapularis was carefully inspected by synovectomy followed by use of a nerve hook to palpate its footprint with a posterior shift test, the subscapularis footprint was noted to have a 50% upper subscapularis tear with retraction medially.  This was leading to possible instability of long head of the biceps.  The biceps was otherwise normal through the intertubercular groove.  The deep surface of the supraspinatus,  infraspinatus, and teres minor inspected.  The footprints were normal for the teres minor and infraspinatus.  There was some fraying of the supraspinatus.  The inferior recess was examined.  The glenoid was examined and the humeral head was examined.  The hyaline cartilage on the glenoid was normal.  The posterior labrum was intact.  The superior humeral head had grade 3 and 4 chondromalacia, possibly due to prior trauma years ago. We then set about repairing the subscapularis arthroscopically.  An anterior superior lateral portal was placed with a clear cannula.  We placed a looped fiber tape mattress suture in the sound part of the subscapularis tendon with about a 5 mm bite.  We then created an anterior portal with another clear cannula, placed a 4.75-mm swivel lock, bringing the subscapularis back to an anatomic footprint on the decorticated lesser tuberosity.  This created a medial buttress for the long head of the biceps.  The fiber tapes were trimmed with the arthroscopic cutter followed by photographic documentation of the complete repair.  We then removed the scope from the glenohumeral joint, proceeded to the subacromial space.  Florid bursitis was noted.  We were quite challenged to enter the subacromial space laterally due to the unusual type 3 osteophyte.  We ultimately used an anterior superior lateral portal to gain entry to the subacromial space, followed by meticulous bursectomy, release of coracoacromial ligament, and leveling the acromion anterolaterally and medially to a type 1 flat morphology.  After hemostasis was achieved, the capsule of the [facility] joint was inspected. There was a very large medial osteophyte on the acromion, particularly posteriorly.  This was removed with the suction bur followed by resection of the distal centimeter of the clavicle with suction bur. Hemostasis was achieved with bipolar cautery.  Mr. Dershem being a chronic hypertensive patient,  we were challenged throughout the procedure with rather elevated systolic blood pressure which rendered visualization at times to challenging.  We controlled bleeding with pump pressures in the range of 50-60 mmHg.  A suction shaver was used to thoroughly clean the subacromial space followed by inspection of the bursal side of the cuff.  We did not identify a full-thickness rotator cuff tear of the supraspinatus or infraspinatus tendon.  The scope equipment was removed.  The portals were repaired with intradermal 3-0 Prolene.  Mr. Gauthier was placed in a dressing of Steri- Strips, sterile Aguilar, ABD pads, and paper tape.  As hardware was implanted, he will be admitted to the recovery care center for observation of his vital signs particularly with his history of sleep apnea, and will be provided Ancef 2 g IV q.6 hours x3 doses.     Gary Mighty Hermilo Dutter, M.D.     RVS/MEDQ  D:  02/23/2014  T:  02/24/2014  Job:  [id number]

## 2014-06-07 ENCOUNTER — Other Ambulatory Visit: Payer: Self-pay | Admitting: Family Medicine

## 2014-06-07 DIAGNOSIS — M25561 Pain in right knee: Secondary | ICD-10-CM

## 2014-06-09 ENCOUNTER — Other Ambulatory Visit: Payer: Managed Care, Other (non HMO)

## 2014-06-14 ENCOUNTER — Other Ambulatory Visit: Payer: Managed Care, Other (non HMO)

## 2015-05-16 ENCOUNTER — Other Ambulatory Visit: Payer: Self-pay | Admitting: Orthopedic Surgery

## 2015-05-25 ENCOUNTER — Encounter (HOSPITAL_BASED_OUTPATIENT_CLINIC_OR_DEPARTMENT_OTHER): Payer: Self-pay | Admitting: *Deleted

## 2015-05-29 ENCOUNTER — Other Ambulatory Visit: Payer: Self-pay

## 2015-05-29 ENCOUNTER — Encounter (HOSPITAL_BASED_OUTPATIENT_CLINIC_OR_DEPARTMENT_OTHER)
Admission: RE | Admit: 2015-05-29 | Discharge: 2015-05-29 | Disposition: A | Discharge status: Home / Self Care | Payer: Managed Care, Other (non HMO) | Source: Ambulatory Visit | Attending: Orthopedic Surgery | Admitting: Orthopedic Surgery

## 2015-05-29 DIAGNOSIS — Y939 Activity, unspecified: Secondary | ICD-10-CM | POA: Diagnosis not present

## 2015-05-29 DIAGNOSIS — Z87891 Personal history of nicotine dependence: Secondary | ICD-10-CM | POA: Diagnosis not present

## 2015-05-29 DIAGNOSIS — I10 Essential (primary) hypertension: Secondary | ICD-10-CM | POA: Insufficient documentation

## 2015-05-29 DIAGNOSIS — G4733 Obstructive sleep apnea (adult) (pediatric): Secondary | ICD-10-CM | POA: Diagnosis not present

## 2015-05-29 DIAGNOSIS — M1990 Unspecified osteoarthritis, unspecified site: Secondary | ICD-10-CM | POA: Diagnosis not present

## 2015-05-29 DIAGNOSIS — Y929 Unspecified place or not applicable: Secondary | ICD-10-CM | POA: Diagnosis not present

## 2015-05-29 DIAGNOSIS — Z9989 Dependence on other enabling machines and devices: Secondary | ICD-10-CM | POA: Diagnosis not present

## 2015-05-29 DIAGNOSIS — S83241A Other tear of medial meniscus, current injury, right knee, initial encounter: Secondary | ICD-10-CM | POA: Diagnosis not present

## 2015-05-29 DIAGNOSIS — M2241 Chondromalacia patellae, right knee: Secondary | ICD-10-CM | POA: Diagnosis not present

## 2015-05-29 DIAGNOSIS — Z79899 Other long term (current) drug therapy: Secondary | ICD-10-CM | POA: Diagnosis not present

## 2015-05-29 DIAGNOSIS — X58XXXA Exposure to other specified factors, initial encounter: Secondary | ICD-10-CM | POA: Diagnosis not present

## 2015-05-29 DIAGNOSIS — Y990 Civilian activity done for income or pay: Secondary | ICD-10-CM | POA: Diagnosis not present

## 2015-05-29 DIAGNOSIS — Z01810 Encounter for preprocedural cardiovascular examination: Secondary | ICD-10-CM | POA: Diagnosis not present

## 2015-05-29 LAB — BASIC METABOLIC PANEL
Anion gap: 8 (ref 5–15)
BUN: 11 mg/dL (ref 6–20)
CALCIUM: 9.6 mg/dL (ref 8.9–10.3)
CHLORIDE: 101 mmol/L (ref 101–111)
CO2: 30 mmol/L (ref 22–32)
Creatinine, Ser: 0.92 mg/dL (ref 0.61–1.24)
GFR calc non Af Amer: >60 mL/min (ref >60)
Glucose, Bld: 124 mg/dL — ABNORMAL HIGH (ref 65–99)
Potassium: 3.8 mmol/L (ref 3.5–5.1)
Sodium: 139 mmol/L (ref 135–145)

## 2015-05-29 NOTE — H&P (Signed)
Gary Aguilar is an 54 y.o. male.   Chief Complaint: Right Knee Pain  HPI: Gary Aguilar returns with an MRI scan of his right knee.  He has continued catching, popping and pain in the knee that began with an injury at work in 2015. He's had arthroscopies 09/22/2014 and 03/02/2015, with findings of medial meniscal tears and chondromalacia.  His pain is along the medial joint line and he reports intermittent swelling.  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  Past Medical History  Diagnosis Date  . Hypertension   . Hypercholesteremia   . Wears glasses   . Arthritis   . Sleep apnea 2009    severe osa-uses cpap-  . Acute medial meniscal tear     right     Past Surgical History  Procedure Laterality Date  . Nasal septum surgery  2006    septoplasty-turb reduction  . Elbow surgery Left 2006    tendon repair  . Colonoscopy    . Sinus exploration  2009    epitaxis-caudry-lt   . Shoulder arthroscopy with rotator cuff repair and subacromial decompression Right 02/23/2014    Procedure: RIGHT SHOULDER ARTHROSCOPY SUBACROMIAL DECOMPRESSION DISTAL CLAVICLE RESECTION ROTATOR CUFF REPAIR ;  Surgeon: Wyn Forster., MD;  Location: [facility];  Service: Orthopedics;  Laterality: Right;    History reviewed. No pertinent family history. Social History:  reports that he has quit smoking. His smokeless tobacco use includes Chew. He reports that he drinks alcohol. He reports that he does not use illicit drugs.  Allergies:  Allergies  Allergen Reactions  . Ace Inhibitors Swelling    No prescriptions prior to admission    No results found for this or any previous visit (from the past 48 hour(s)). No results found.  Review of Systems  Constitutional: Negative.   HENT: Negative.   Eyes: Negative.   Respiratory: Negative.   Cardiovascular: Negative.   Gastrointestinal: Negative.   Genitourinary: Negative.   Musculoskeletal:  Positive for joint pain.  Skin: Negative.   Neurological: Negative.   Endo/Heme/Allergies: Negative.   Psychiatric/Behavioral: Negative.     Height 6' (1.829 m), weight 113.399 kg (250 lb). Physical Exam  Constitutional: He is oriented to person, place, and time. He appears well-developed and well-nourished.  HENT:  Head: Normocephalic and atraumatic.  Eyes: Pupils are equal, round, and reactive to light.  Neck: Normal range of motion. Neck supple.  Cardiovascular: Intact distal pulses.   Respiratory: Effort normal.  Musculoskeletal: He exhibits tenderness.  Tender along the medial joint line of the right knee.  One plus effusion range of motion is 5/130 collateral ligaments are stable.  McMurray's test reproduces pain as does squatting.  Patient's skin is intact.  There are no cuts, scrapes or abrasions.    Neurological: He is alert and oriented to person, place, and time.  Skin: Skin is warm and dry.  Psychiatric: He has a normal mood and affect. His behavior is normal. Judgment and thought content normal.     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  His photographs that show some grade 3 chondromalacia the medial femoral condyle and he is certainly at risk for needing a knee replacement at some point in the future, but he  is not bone-on-bone at this time.  I will see him back at the time of surgical intervention and I have let him know that a second opinion may be requested by the insurance adjuster, which is certainly reasonable.  Gary Aguilar R 05/29/2015, 12:15 PM

## 2015-05-30 ENCOUNTER — Ambulatory Visit (HOSPITAL_BASED_OUTPATIENT_CLINIC_OR_DEPARTMENT_OTHER): Payer: Worker's Compensation | Admitting: Anesthesiology

## 2015-05-30 ENCOUNTER — Encounter (HOSPITAL_BASED_OUTPATIENT_CLINIC_OR_DEPARTMENT_OTHER): Payer: Self-pay | Admitting: Anesthesiology

## 2015-05-30 ENCOUNTER — Encounter (HOSPITAL_BASED_OUTPATIENT_CLINIC_OR_DEPARTMENT_OTHER)
Admission: RE | Disposition: A | Discharge status: Home / Self Care | Payer: Self-pay | Source: Ambulatory Visit | Attending: Orthopedic Surgery

## 2015-05-30 ENCOUNTER — Ambulatory Visit (HOSPITAL_BASED_OUTPATIENT_CLINIC_OR_DEPARTMENT_OTHER)
Admission: RE | Admit: 2015-05-30 | Discharge: 2015-05-30 | Disposition: A | Discharge status: Home / Self Care | Payer: Worker's Compensation | Source: Ambulatory Visit | Attending: Orthopedic Surgery | Admitting: Orthopedic Surgery

## 2015-05-30 DIAGNOSIS — S83241A Other tear of medial meniscus, current injury, right knee, initial encounter: Secondary | ICD-10-CM | POA: Insufficient documentation

## 2015-05-30 DIAGNOSIS — I10 Essential (primary) hypertension: Secondary | ICD-10-CM | POA: Insufficient documentation

## 2015-05-30 DIAGNOSIS — M2241 Chondromalacia patellae, right knee: Secondary | ICD-10-CM | POA: Insufficient documentation

## 2015-05-30 DIAGNOSIS — Y990 Civilian activity done for income or pay: Secondary | ICD-10-CM | POA: Insufficient documentation

## 2015-05-30 DIAGNOSIS — S83206A Unspecified tear of unspecified meniscus, current injury, right knee, initial encounter: Secondary | ICD-10-CM | POA: Diagnosis present

## 2015-05-30 DIAGNOSIS — Z79899 Other long term (current) drug therapy: Secondary | ICD-10-CM | POA: Insufficient documentation

## 2015-05-30 DIAGNOSIS — M1990 Unspecified osteoarthritis, unspecified site: Secondary | ICD-10-CM | POA: Insufficient documentation

## 2015-05-30 DIAGNOSIS — G4733 Obstructive sleep apnea (adult) (pediatric): Secondary | ICD-10-CM | POA: Insufficient documentation

## 2015-05-30 DIAGNOSIS — Y929 Unspecified place or not applicable: Secondary | ICD-10-CM | POA: Insufficient documentation

## 2015-05-30 DIAGNOSIS — X58XXXA Exposure to other specified factors, initial encounter: Secondary | ICD-10-CM | POA: Insufficient documentation

## 2015-05-30 DIAGNOSIS — Z87891 Personal history of nicotine dependence: Secondary | ICD-10-CM | POA: Insufficient documentation

## 2015-05-30 DIAGNOSIS — Z9989 Dependence on other enabling machines and devices: Secondary | ICD-10-CM | POA: Insufficient documentation

## 2015-05-30 DIAGNOSIS — Y939 Activity, unspecified: Secondary | ICD-10-CM | POA: Insufficient documentation

## 2015-05-30 HISTORY — DX: Other tear of medial meniscus, current injury, unspecified knee, initial encounter: S83.249A

## 2015-05-30 HISTORY — PX: KNEE ARTHROSCOPY WITH MEDIAL MENISECTOMY: SHX5651

## 2015-05-30 SURGERY — ARTHROSCOPY, KNEE, WITH MEDIAL MENISCECTOMY
Anesthesia: General | Site: Knee | Laterality: Right

## 2015-05-30 MED ORDER — DEXAMETHASONE SODIUM PHOSPHATE 4 MG/ML IJ SOLN
INTRAMUSCULAR | Status: DC | PRN
  Administered 2015-05-30: 10 mg via INTRAVENOUS

## 2015-05-30 MED ORDER — MIDAZOLAM HCL 5 MG/5ML IJ SOLN
INTRAMUSCULAR | Status: DC | PRN
  Administered 2015-05-30: 2 mg via INTRAVENOUS

## 2015-05-30 MED ORDER — DEXTROSE-NACL 5-0.45 % IV SOLN: INTRAVENOUS | Status: DC

## 2015-05-30 MED ORDER — SODIUM CHLORIDE 0.9 % IR SOLN
Status: DC | PRN
  Administered 2015-05-30: 3000 mL

## 2015-05-30 MED ORDER — GLYCOPYRROLATE 0.2 MG/ML IJ SOLN: 0.2000 mg | Freq: Once | INTRAMUSCULAR | Status: DC | PRN

## 2015-05-30 MED ORDER — BUPIVACAINE HCL (PF) 0.5 % IJ SOLN
INTRAMUSCULAR | Status: AC
  Filled 2015-05-30: qty 30

## 2015-05-30 MED ORDER — MIDAZOLAM HCL 2 MG/2ML IJ SOLN: 1.0000 mg | INTRAMUSCULAR | Status: DC | PRN

## 2015-05-30 MED ORDER — FENTANYL CITRATE (PF) 100 MCG/2ML IJ SOLN
INTRAMUSCULAR | Status: DC | PRN
  Administered 2015-05-30 (×2): 25 ug via INTRAVENOUS
  Administered 2015-05-30: 100 ug via INTRAVENOUS
  Administered 2015-05-30: 50 ug via INTRAVENOUS

## 2015-05-30 MED ORDER — CEFAZOLIN SODIUM-DEXTROSE 2-3 GM-% IV SOLR
INTRAVENOUS | Status: AC
  Filled 2015-05-30: qty 50

## 2015-05-30 MED ORDER — SCOPOLAMINE 1 MG/3DAYS TD PT72: 1.0000 | MEDICATED_PATCH | Freq: Once | TRANSDERMAL | Status: DC | PRN

## 2015-05-30 MED ORDER — OXYCODONE-ACETAMINOPHEN 5-325 MG PO TABS: 1.0000 | ORAL_TABLET | ORAL | Status: DC | PRN

## 2015-05-30 MED ORDER — HYDROMORPHONE HCL 1 MG/ML IJ SOLN
0.2500 mg | INTRAMUSCULAR | Status: DC | PRN
  Administered 2015-05-30 (×3): 0.5 mg via INTRAVENOUS

## 2015-05-30 MED ORDER — PROPOFOL 10 MG/ML IV BOLUS
INTRAVENOUS | Status: DC | PRN
  Administered 2015-05-30: 300 mg via INTRAVENOUS
  Administered 2015-05-30: 30 mg via INTRAVENOUS

## 2015-05-30 MED ORDER — ONDANSETRON HCL 4 MG/2ML IJ SOLN
INTRAMUSCULAR | Status: DC | PRN
  Administered 2015-05-30: 4 mg via INTRAVENOUS

## 2015-05-30 MED ORDER — LIDOCAINE HCL (CARDIAC) 20 MG/ML IV SOLN
INTRAVENOUS | Status: DC | PRN
  Administered 2015-05-30: 50 mg via INTRAVENOUS

## 2015-05-30 MED ORDER — CEFAZOLIN SODIUM-DEXTROSE 2-3 GM-% IV SOLR
2.0000 g | INTRAVENOUS | Status: AC
  Administered 2015-05-30: 2 g via INTRAVENOUS

## 2015-05-30 MED ORDER — BUPIVACAINE-EPINEPHRINE 0.5% -1:200000 IJ SOLN
INTRAMUSCULAR | Status: DC | PRN
  Administered 2015-05-30: 20 mL

## 2015-05-30 MED ORDER — FENTANYL CITRATE (PF) 100 MCG/2ML IJ SOLN
INTRAMUSCULAR | Status: AC
  Filled 2015-05-30: qty 6

## 2015-05-30 MED ORDER — MIDAZOLAM HCL 2 MG/2ML IJ SOLN
INTRAMUSCULAR | Status: AC
  Filled 2015-05-30: qty 2

## 2015-05-30 MED ORDER — HYDROMORPHONE HCL 1 MG/ML IJ SOLN
INTRAMUSCULAR | Status: AC
  Filled 2015-05-30: qty 1

## 2015-05-30 MED ORDER — OXYCODONE HCL 5 MG PO TABS
ORAL_TABLET | ORAL | Status: AC
  Filled 2015-05-30: qty 1

## 2015-05-30 MED ORDER — LACTATED RINGERS IV SOLN
INTRAVENOUS | Status: DC
  Administered 2015-05-30 (×2): via INTRAVENOUS

## 2015-05-30 MED ORDER — CHLORHEXIDINE GLUCONATE 4 % EX LIQD: 60.0000 mL | Freq: Once | CUTANEOUS | Status: DC

## 2015-05-30 MED ORDER — OXYCODONE HCL 5 MG/5ML PO SOLN: 5.0000 mg | Freq: Once | ORAL | Status: AC | PRN

## 2015-05-30 MED ORDER — OXYCODONE HCL 5 MG PO TABS
5.0000 mg | ORAL_TABLET | Freq: Once | ORAL | Status: AC | PRN
  Administered 2015-05-30: 5 mg via ORAL

## 2015-05-30 MED ORDER — FENTANYL CITRATE (PF) 100 MCG/2ML IJ SOLN: 50.0000 ug | INTRAMUSCULAR | Status: DC | PRN

## 2015-05-30 MED ORDER — MEPERIDINE HCL 25 MG/ML IJ SOLN: 6.2500 mg | INTRAMUSCULAR | Status: DC | PRN

## 2015-05-30 MED ORDER — EPINEPHRINE HCL 1 MG/ML IJ SOLN
INTRAMUSCULAR | Status: AC
Start: 2015-05-30 — End: 2015-05-30
  Filled 2015-05-30: qty 1

## 2015-05-30 MED ORDER — BUPIVACAINE-EPINEPHRINE (PF) 0.5% -1:200000 IJ SOLN
INTRAMUSCULAR | Status: AC
  Filled 2015-05-30: qty 30

## 2015-05-30 SURGICAL SUPPLY — 42 items
BANDAGE ELASTIC 6 VELCRO ST LF (GAUZE/BANDAGES/DRESSINGS) ×3 IMPLANT
BLADE 4.2CUDA (BLADE) IMPLANT
BLADE CUTTER GATOR 3.5 (BLADE) ×2 IMPLANT
BLADE GREAT WHITE 4.2 (BLADE) ×1 IMPLANT
BLADE GREAT WHITE 4.2MM (BLADE) ×1
BNDG COHESIVE 6X5 TAN STRL LF (GAUZE/BANDAGES/DRESSINGS) ×3 IMPLANT
DRAPE ARTHROSCOPY W/POUCH 114 (DRAPES) ×3 IMPLANT
DURAPREP 26ML APPLICATOR (WOUND CARE) ×3 IMPLANT
ELECT MENISCUS 165MM 90D (ELECTRODE) IMPLANT
ELECT REM PT RETURN 9FT ADLT (ELECTROSURGICAL)
ELECTRODE REM PT RTRN 9FT ADLT (ELECTROSURGICAL) IMPLANT
GAUZE SPONGE 4X4 12PLY STRL (GAUZE/BANDAGES/DRESSINGS) ×3 IMPLANT
GAUZE XEROFORM 1X8 LF (GAUZE/BANDAGES/DRESSINGS) ×3 IMPLANT
GLOVE BIO SURGEON STRL SZ7 (GLOVE) ×2 IMPLANT
GLOVE BIO SURGEON STRL SZ7.5 (GLOVE) ×3 IMPLANT
GLOVE BIO SURGEON STRL SZ8.5 (GLOVE) ×3 IMPLANT
GLOVE BIOGEL PI IND STRL 7.0 (GLOVE) IMPLANT
GLOVE BIOGEL PI IND STRL 8 (GLOVE) ×1 IMPLANT
GLOVE BIOGEL PI IND STRL 9 (GLOVE) ×1 IMPLANT
GLOVE BIOGEL PI INDICATOR 7.0 (GLOVE) ×2
GLOVE BIOGEL PI INDICATOR 8 (GLOVE) ×2
GLOVE BIOGEL PI INDICATOR 9 (GLOVE) ×2
GOWN STRL REUS W/ TWL LRG LVL3 (GOWN DISPOSABLE) ×2 IMPLANT
GOWN STRL REUS W/ TWL XL LVL3 (GOWN DISPOSABLE) IMPLANT
GOWN STRL REUS W/TWL LRG LVL3 (GOWN DISPOSABLE) ×3
GOWN STRL REUS W/TWL XL LVL3 (GOWN DISPOSABLE) ×6 IMPLANT
IV NS IRRIG 3000ML ARTHROMATIC (IV SOLUTION) ×3 IMPLANT
KNEE WRAP E Z 3 GEL PACK (MISCELLANEOUS) ×3 IMPLANT
MANIFOLD NEPTUNE II (INSTRUMENTS) ×2 IMPLANT
NDL SAFETY ECLIPSE 18X1.5 (NEEDLE) ×1 IMPLANT
NEEDLE HYPO 18GX1.5 SHARP (NEEDLE) ×3
PACK ARTHROSCOPY DSU (CUSTOM PROCEDURE TRAY) ×3 IMPLANT
PACK BASIN DAY SURGERY FS (CUSTOM PROCEDURE TRAY) ×3 IMPLANT
PAD ALCOHOL SWAB (MISCELLANEOUS) ×3 IMPLANT
PENCIL BUTTON HOLSTER BLD 10FT (ELECTRODE) IMPLANT
SET ARTHROSCOPY TUBING (MISCELLANEOUS) ×3
SET ARTHROSCOPY TUBING LN (MISCELLANEOUS) ×1 IMPLANT
SLEEVE SCD COMPRESS KNEE MED (MISCELLANEOUS) ×2 IMPLANT
SYR 5ML LL (SYRINGE) ×3 IMPLANT
TOWEL OR 17X24 6PK STRL BLUE (TOWEL DISPOSABLE) ×3 IMPLANT
WAND STAR VAC 90 (SURGICAL WAND) ×2 IMPLANT
WATER STERILE IRR 1000ML POUR (IV SOLUTION) ×3 IMPLANT

## 2015-05-30 NOTE — Op Note (Signed)
Pre-Op Dx: Recurrent Right Knee Medial Meniscal tear  Postop Dx: Same   Procedure: Arthroscopic R Knee Medial Menisectomy  Surgeon: Feliberto Gottron. Turner Daniels M.D.  Assist: Tomi Likens. Gaylene Brooks  (present throughout entire procedure and necessary for timely completion of the procedure) Anes: General LMA  EBL: Minimal  Fluids: 800 cc   Indications:  Patient has had 2 prior medial meniscectomies last year and a few months ago has persistent pain and repeat MRI scan shows possible recurrent medial meniscal tear.. Pt has failed conservative treatment with anti-inflammatory medicines, physical therapy, and modified activites but did get good temporarily from an intra-articular cortisone injection. Pain has recurred and patient desires elective arthroscopic evaluation and treatment of knee. Risks and benefits of surgery have been discussed and questions answered.  Procedure: Patient identified by arm band and taken to the operating room at the day surgery Center. The appropriate anesthetic monitors were attached, and General LMA anesthesia was induced without difficulty. Lateral post was applied to the table and the lower extremity was prepped and draped in usual sterile fashion from the ankle to the midthigh. Time out procedure was performed. We began the operation by making standard inferior lateral and inferior medial peripatellar portals with a #11 blade allowing introduction of the arthroscope through the inferior lateral portal and the out flow to the inferior medial portal. Pump pressure was set at 100 mmHg and diagnostic arthroscopy  revealed minimal chondromalacia to the patellofemoral joint, the lateral compartment, the cruciate ligaments were intact. On the medial side the posterior horn of the medial meniscus had some possible horizontal cleavage tearing. At that point we brought up the straight biters and removed the posterior and medial horns of the medial meniscus, we also used a 4.2 gray-white sucker  shaver and finished with a 3.5 mm Gator sucker shaver. We did cauterize with a ArthroCare wand. The knee was irrigated out normal saline solution. A dressing of xerofoam 4 x 4 dressing sponges, web roll and an Ace wrap was applied. The patient was awakened extubated and taken to the recovery without difficulty.    Signed: Nestor Lewandowsky, MD

## 2015-05-30 NOTE — Anesthesia Procedure Notes (Signed)
Procedure Name: LMA Insertion Date/Time: 05/30/2015 1:47 PM Performed by: Genevieve Norlander L Pre-anesthesia Checklist: Patient identified, Emergency Drugs available, Suction available and Patient being monitored Patient Re-evaluated:Patient Re-evaluated prior to inductionOxygen Delivery Method: Circle System Utilized Preoxygenation: Pre-oxygenation with 100% oxygen Intubation Type: IV induction Ventilation: Mask ventilation without difficulty LMA: LMA inserted LMA Size: 5.0 Number of attempts: 1 Airway Equipment and Method: Bite block Placement Confirmation: positive ETCO2 Tube secured with: Tape Dental Injury: Teeth and Oropharynx as per pre-operative assessment

## 2015-05-30 NOTE — Interval H&P Note (Signed)
History and Physical Interval Note:  05/30/2015 1:36 PM  Gary Aguilar  has presented today for surgery, with the diagnosis of RECURRENT RIGHT MEDIAL MENISCUS TEAR/CHONDROMALACIA PATELA  The various methods of treatment have been discussed with the patient and family. After consideration of risks, benefits and other options for treatment, the patient has consented to  Procedure(s) with comments: ARTHROSCOPY KNEE (Right) - RIGHT KNEE ARTHROSCOPY as a surgical intervention .  The patient's history has been reviewed, patient examined, no change in status, stable for surgery.  I have reviewed the patient's chart and labs.  Questions were answered to the patient's satisfaction.     Nestor Lewandowsky

## 2015-05-30 NOTE — Anesthesia Postprocedure Evaluation (Signed)
  Anesthesia Post-op Note  Patient: Gary Aguilar  Procedure(s) Performed: Procedure(s): KNEE ARTHROSCOPY WITH TOTAL MEDIAL MENISECTOMY (Right)  Patient Location: PACU  Anesthesia Type: General   Level of Consciousness: awake, alert  and oriented  Airway and Oxygen Therapy: Patient Spontanous Breathing  Post-op Pain: mild  Post-op Assessment: Post-op Vital signs reviewed  Post-op Vital Signs: Reviewed  Last Vitals:  Filed Vitals:   05/30/15 1500  BP: 130/83  Pulse: 65  Temp:   Resp: 16    Complications: No apparent anesthesia complications

## 2015-05-30 NOTE — Discharge Instructions (Addendum)
Arthroscopic Procedure, Knee °An arthroscopic procedure can find what is wrong with your knee. °PROCEDURE °Arthroscopy is a surgical technique that allows your orthopedic surgeon to diagnose and treat your knee injury with accuracy. They will look into your knee through a small instrument. This is almost like a small (pencil sized) telescope. Because arthroscopy affects your knee less than open knee surgery, you can anticipate a more rapid recovery. Taking an active role by following your caregiver's instructions will help with rapid and complete recovery. Use crutches, rest, elevation, ice, and knee exercises as instructed. The length of recovery depends on various factors including type of injury, age, physical condition, medical conditions, and your rehabilitation. °Your knee is the joint between the large bones (femur and tibia) in your leg. Cartilage covers these bone ends which are smooth and slippery and allow your knee to bend and move smoothly. Two menisci, thick, semi-lunar shaped pads of cartilage which form a rim inside the joint, help absorb shock and stabilize your knee. Ligaments bind the bones together and support your knee joint. Muscles move the joint, help support your knee, and take stress off the joint itself. Because of this all programs and physical therapy to rehabilitate an injured or repaired knee require rebuilding and strengthening your muscles. °AFTER THE PROCEDURE °· After the procedure, you will be moved to a recovery area until most of the effects of the medication have worn off. Your caregiver will discuss the test results with you. °· Only take over-the-counter or prescription medicines for pain, discomfort, or fever as directed by your caregiver. °SEEK MEDICAL CARE IF:  °· You have increased bleeding from your wounds. °· You see redness, swelling, or have increasing pain in your wounds. °· You have pus coming from your wound. °· You have an oral temperature above 102° F (38.9°  C). °· You notice a bad smell coming from the wound or dressing. °· You have severe pain with any motion of your knee. °SEEK IMMEDIATE MEDICAL CARE IF:  °· You develop a rash. °· You have difficulty breathing. °· You have any allergic problems. °Document Released: 10/17/2000 Document Revised: 01/12/2012 Document Reviewed: 05/10/2008 °ExitCare® Patient Information ©2015 [employer]. This information is not intended to replace advice given to you by your health care provider. Make sure you discuss any questions you have with your health care provider. ° ° °Post Anesthesia Home Care Instructions ° °Activity: °Get plenty of rest for the remainder of the day. A responsible adult should stay with you for 24 hours following the procedure.  °For the next 24 hours, DO NOT: °-Drive a car °-Operate machinery °-Drink alcoholic beverages °-Take any medication unless instructed by your physician °-Make any legal decisions or sign important papers. ° °Meals: °Start with liquid foods such as gelatin or soup. Progress to regular foods as tolerated. Avoid greasy, spicy, heavy foods. If nausea and/or vomiting occur, drink only clear liquids until the nausea and/or vomiting subsides. Call your physician if vomiting continues. ° °Special Instructions/Symptoms: °Your throat may feel dry or sore from the anesthesia or the breathing tube placed in your throat during surgery. If this causes discomfort, gargle with warm salt water. The discomfort should disappear within 24 hours. ° °If you had a scopolamine patch placed behind your ear for the management of post- operative nausea and/or vomiting: ° °1. The medication in the patch is effective for 72 hours, after which it should be removed.  Wrap patch in a tissue and discard in the trash. Wash   hands thoroughly with soap and water. °2. You may remove the patch earlier than 72 hours if you experience unpleasant side effects which may include dry mouth, dizziness or visual disturbances. °3.  Avoid touching the patch. Wash your hands with soap and water after contact with the patch. °  ° °

## 2015-05-30 NOTE — Transfer of Care (Signed)
Immediate Anesthesia Transfer of Care Note  Patient: Gary Aguilar  Procedure(s) Performed: Procedure(s): KNEE ARTHROSCOPY WITH TOTAL MEDIAL MENISECTOMY (Right)  Patient Location: PACU  Anesthesia Type:General  Level of Consciousness: awake and patient cooperative  Airway & Oxygen Therapy: Patient Spontanous Breathing and Patient connected to face mask oxygen  Post-op Assessment: Report given to RN and Post -op Vital signs reviewed and stable  Post vital signs: Reviewed and stable  Last Vitals:  Filed Vitals:   05/30/15 1101  BP: 136/70  Pulse: 60  Temp: 37 C  Resp: 18    Complications: No apparent anesthesia complications

## 2015-05-30 NOTE — Anesthesia Preprocedure Evaluation (Signed)
Anesthesia Evaluation  Patient identified by MRN, date of birth, ID band Patient awake    Reviewed: Allergy & Precautions, NPO status , Patient's Chart, lab work & pertinent test results  Airway Mallampati: I  TM Distance: >3 FB Neck ROM: Full    Dental  (+) Teeth Intact, Dental Advisory Given   Pulmonary sleep apnea and Continuous Positive Airway Pressure Ventilation , former smoker,  breath sounds clear to auscultation        Cardiovascular hypertension, Pt. on medications and Pt. on home beta blockers Rhythm:Regular Rate:Normal     Neuro/Psych    GI/Hepatic   Endo/Other    Renal/GU      Musculoskeletal   Abdominal   Peds  Hematology   Anesthesia Other Findings   Reproductive/Obstetrics                             Anesthesia Physical Anesthesia Plan  ASA: II  Anesthesia Plan: General   Post-op Pain Management:    Induction: Intravenous  Airway Management Planned:   Additional Equipment:   Intra-op Plan:   Post-operative Plan: Extubation in OR  Informed Consent: I have reviewed the patients History and Physical, chart, labs and discussed the procedure including the risks, benefits and alternatives for the proposed anesthesia with the patient or authorized representative who has indicated his/her understanding and acceptance.   Dental advisory given  Plan Discussed with: CRNA, Anesthesiologist and Surgeon  Anesthesia Plan Comments:         Anesthesia Quick Evaluation

## 2015-05-31 ENCOUNTER — Encounter (HOSPITAL_BASED_OUTPATIENT_CLINIC_OR_DEPARTMENT_OTHER): Payer: Self-pay | Admitting: Orthopedic Surgery

## 2015-05-31 LAB — POCT HEMOGLOBIN-HEMACUE: Hemoglobin: 14.8 g/dL (ref 13.0–17.0)

## 2015-05-31 NOTE — Addendum Note (Signed)
Addendum  created 05/31/15 1547 by Burna Cash, CRNA   Modules edited: Charges VN

## 2016-02-27 NOTE — Patient Instructions (Signed)
Gary SalvoCalvin D Aguilar  02/27/2016   Your procedure is scheduled on: 03/11/2016    [facility]  Entrance take [place]  elevators to 3rd floor to  Short Stay Center at    0700 AM.  Call this number if you have problems the morning of surgery [phone]   Remember: ONLY 1 PERSON MAY GO WITH YOU TO SHORT STAY TO GET  READY MORNING OF YOUR SURGERY.  Do not eat food or drink liquids :After Midnight.     Take these medicines the morning of surgery with A SIP OF WATER: Metoprolol ( Toprol), Oxycodone if needed                                 You may not have any metal on your body including hair pins and              piercings  Do not wear jewelry, , lotions, powders or perfumes, deodorant                    Men may shave face and neck.   Do not bring valuables to the hospital. [facility] IS NOT             RESPONSIBLE   FOR VALUABLES.  Contacts, dentures or bridgework may not be worn into surgery.  Leave suitcase in the car. After surgery it may be brought to your room.     BRING CPAP MASK AND TUBING     Special Instructions: coughing and deep breathing exercises, leg exercises               Please read over the following fact sheets you were given: _____________________________________________________________________             [facility] - Preparing [facility] Before surgery, you can play an important role.  Because skin is not sterile, your skin needs to be as free of germs as possible.  You can reduce the number of germs on your skin by washing with CHG (chlorahexidine gluconate) soap before surgery.  CHG is an antiseptic cleaner which kills germs and bonds with the skin to continue killing germs even after washing. Please DO NOT use if you have an allergy to CHG or antibacterial soaps.  If your skin becomes reddened/irritated stop using the CHG and inform your nurse when you arrive at Short Stay. Do not shave (including legs and underarms) for  at least 48 hours prior to the first CHG shower.  You may shave your face/neck. Please follow these instructions carefully:  1.  Shower with CHG Soap the night before surgery and the  morning of Surgery.  2.  If you choose to wash your hair, wash your hair first as usual with your  normal  shampoo.  3.  After you shampoo, rinse your hair and body thoroughly to remove the  shampoo.                           4.  Use CHG as you would any other liquid soap.  You can apply chg directly  to the skin and wash                       Gently with a scrungie or  clean washcloth.  5.  Apply the CHG Soap to your body ONLY FROM THE NECK DOWN.   Do not use on face/ open                           Wound or open sores. Avoid contact with eyes, ears mouth and genitals (private parts).                       Wash face,  Genitals (private parts) with your normal soap.             6.  Wash thoroughly, paying special attention to the area where your surgery  will be performed.  7.  Thoroughly rinse your body with warm water from the neck down.  8.  DO NOT shower/wash with your normal soap after using and rinsing off  the CHG Soap.                9.  Pat yourself dry with a clean towel.            10.  Wear clean pajamas.            11.  Place clean sheets on your bed the night of your first shower and do not  sleep with pets. Day of Surgery : Do not apply any lotions/deodorants the morning of surgery.  Please wear clean clothes to the hospital/surgery center.  FAILURE TO FOLLOW THESE INSTRUCTIONS MAY RESULT IN THE CANCELLATION OF YOUR SURGERY PATIENT SIGNATURE_________________________________  NURSE SIGNATURE__________________________________  ________________________________________________________________________  WHAT IS A BLOOD TRANSFUSION? Blood Transfusion Information  A transfusion is the replacement of blood or some of its parts. Blood is made up of multiple cells which provide different functions.  Red  blood cells carry oxygen and are used for blood loss replacement.  White blood cells fight against infection.  Platelets control bleeding.  Plasma helps clot blood.  Other blood products are available for specialized needs, such as hemophilia or other clotting disorders. BEFORE THE TRANSFUSION  Who gives blood for transfusions?   Healthy volunteers who are fully evaluated to make sure their blood is safe. This is blood bank blood. Transfusion therapy is the safest it has ever been in the practice of medicine. Before blood is taken from a donor, a complete history is taken to make sure that person has no history of diseases nor engages in risky social behavior (examples are intravenous drug use or sexual activity with multiple partners). The donor's travel history is screened to minimize risk of transmitting infections, such as malaria. The donated blood is tested for signs of infectious diseases, such as HIV and hepatitis. The blood is then tested to be sure it is compatible with you in order to minimize the chance of a transfusion reaction. If you or a relative donates blood, this is often done in anticipation of surgery and is not appropriate for emergency situations. It takes many days to process the donated blood. RISKS AND COMPLICATIONS Although transfusion therapy is very safe and saves many lives, the main dangers of transfusion include:  1. Getting an infectious disease. 2. Developing a transfusion reaction. This is an allergic reaction to something in the blood you were given. Every precaution is taken to prevent this. The decision to have a blood transfusion has been considered carefully by your caregiver before blood is given. Blood is not given unless the benefits outweigh the risks. AFTER THE TRANSFUSION  Right after receiving a blood transfusion, you will usually feel much better and more energetic. This is especially true if your red blood cells have gotten low (anemic). The  transfusion raises the level of the red blood cells which carry oxygen, and this usually causes an energy increase.  The nurse administering the transfusion will monitor you carefully for complications. HOME CARE INSTRUCTIONS  No special instructions are needed after a transfusion. You may find your energy is better. Speak with your caregiver about any limitations on activity for underlying diseases you may have. SEEK MEDICAL CARE IF:   Your condition is not improving after your transfusion.  You develop redness or irritation at the intravenous (IV) site. SEEK IMMEDIATE MEDICAL CARE IF:  Any of the following symptoms occur over the next 12 hours:  Shaking chills.  You have a temperature by mouth above 102 F (38.9 C), not controlled by medicine.  Chest, back, or muscle pain.  People around you feel you are not acting correctly or are confused.  Shortness of breath or difficulty breathing.  Dizziness and fainting.  You get a rash or develop hives.  You have a decrease in urine output.  Your urine turns a dark color or changes to pink, red, or brown. Any of the following symptoms occur over the next 10 days:  You have a temperature by mouth above 102 F (38.9 C), not controlled by medicine.  Shortness of breath.  Weakness after normal activity.  The white part of the eye turns yellow (jaundice).  You have a decrease in the amount of urine or are urinating less often.  Your urine turns a dark color or changes to pink, red, or brown. Document Released: 10/17/2000 Document Revised: 01/12/2012 Document Reviewed: 06/05/2008 ExitCare Patient Information 2014 [place].  _______________________________________________________________________  Incentive Spirometer  An incentive spirometer is a tool that can help keep your lungs clear and active. This tool measures how well you are filling your lungs with each breath. Taking long deep breaths may help reverse or  decrease the chance of developing breathing (pulmonary) problems (especially infection) following:  A long period of time when you are unable to move or be active. BEFORE THE PROCEDURE   If the spirometer includes an indicator to show your best effort, your nurse or respiratory therapist will set it to a desired goal.  If possible, sit up straight or lean slightly forward. Try not to slouch.  Hold the incentive spirometer in an upright position. INSTRUCTIONS FOR USE  3. Sit on the edge of your bed if possible, or sit up as far as you can in bed or on a chair. 4. Hold the incentive spirometer in an upright position. 5. Breathe out normally. 6. Place the mouthpiece in your mouth and seal your lips tightly around it. 7. Breathe in slowly and as deeply as possible, raising the piston or the ball toward the top of the column. 8. Hold your breath for 3-5 seconds or for as long as possible. Allow the piston or ball to fall to the bottom of the column. 9. Remove the mouthpiece from your mouth and breathe out normally. 10. Rest for a few seconds and repeat Steps 1 through 7 at least 10 times every 1-2 hours when you are awake. Take your time and take a few normal breaths between deep breaths. 11. The spirometer may include an indicator to show your best effort. Use the indicator as a goal to work toward during each repetition. 12. After  each set of 10 deep breaths, practice coughing to be sure your lungs are clear. If you have an incision (the cut made at the time of surgery), support your incision when coughing by placing a pillow or rolled up towels firmly against it. Once you are able to get out of bed, walk around indoors and cough well. You may stop using the incentive spirometer when instructed by your caregiver.  RISKS AND COMPLICATIONS  Take your time so you do not get dizzy or light-headed.  If you are in pain, you may need to take or ask for pain medication before doing incentive  spirometry. It is harder to take a deep breath if you are having pain. AFTER USE  Rest and breathe slowly and easily.  It can be helpful to keep track of a log of your progress. Your caregiver can provide you with a simple table to help with this. If you are using the spirometer at home, follow these instructions: [place] IF:   You are having difficultly using the spirometer.  You have trouble using the spirometer as often as instructed.  Your pain medication is not giving enough relief while using the spirometer.  You develop fever of 100.5 F (38.1 C) or higher. SEEK IMMEDIATE MEDICAL CARE IF:   You cough up bloody sputum that had not been present before.  You develop fever of 102 F (38.9 C) or greater.  You develop worsening pain at or near the incision site. MAKE SURE YOU:   Understand these instructions.  Will watch your condition.  Will get help right away if you are not doing well or get worse. Document Released: 03/02/2007 Document Revised: 01/12/2012 Document Reviewed: 05/03/2007 [facility] Patient Information 2014 [place], [place].   ________________________________________________________________________

## 2016-02-29 ENCOUNTER — Encounter (HOSPITAL_COMMUNITY): Payer: Self-pay

## 2016-02-29 ENCOUNTER — Encounter (HOSPITAL_COMMUNITY)
Admission: RE | Admit: 2016-02-29 | Discharge: 2016-02-29 | Disposition: A | Discharge status: Home / Self Care | Payer: Worker's Compensation | Source: Ambulatory Visit | Attending: Orthopedic Surgery | Admitting: Orthopedic Surgery

## 2016-02-29 DIAGNOSIS — Z01812 Encounter for preprocedural laboratory examination: Secondary | ICD-10-CM | POA: Insufficient documentation

## 2016-02-29 HISTORY — DX: Other complications of anesthesia, initial encounter: T88.59XA

## 2016-02-29 HISTORY — DX: Adverse effect of unspecified anesthetic, initial encounter: T41.45XA

## 2016-02-29 LAB — BASIC METABOLIC PANEL
ANION GAP: 8 (ref 5–15)
BUN: 15 mg/dL (ref 6–20)
CALCIUM: 9.5 mg/dL (ref 8.9–10.3)
CO2: 27 mmol/L (ref 22–32)
Chloride: 107 mmol/L (ref 101–111)
Creatinine, Ser: 1 mg/dL (ref 0.61–1.24)
GFR calc non Af Amer: >60 mL/min (ref >60)
Glucose, Bld: 104 mg/dL — ABNORMAL HIGH (ref 65–99)
POTASSIUM: 4 mmol/L (ref 3.5–5.1)
Sodium: 142 mmol/L (ref 135–145)

## 2016-02-29 LAB — TYPE AND SCREEN
ABO/RH(D): B POS
Antibody Screen: NEGATIVE

## 2016-02-29 LAB — CBC
HEMATOCRIT: 43.6 % (ref 39.0–52.0)
HEMOGLOBIN: 14.3 g/dL (ref 13.0–17.0)
MCH: 27.4 pg (ref 26.0–34.0)
MCHC: 32.8 g/dL (ref 30.0–36.0)
MCV: 83.5 fL (ref 78.0–100.0)
Platelets: 172 10*3/uL (ref 150–400)
RBC: 5.22 MIL/uL (ref 4.22–5.81)
RDW: 13.8 % (ref 11.5–15.5)
WBC: 5 10*3/uL (ref 4.0–10.5)

## 2016-02-29 LAB — SURGICAL PCR SCREEN
MRSA, PCR: NEGATIVE
STAPHYLOCOCCUS AUREUS: NEGATIVE

## 2016-02-29 LAB — ABO/RH: ABO/RH(D): B POS

## 2016-02-29 NOTE — Pre-Procedure Instructions (Addendum)
Medical Clearance Dr. Tiburcio PeaHarris, 02/25/16 EKG 05-29-15, epic

## 2016-03-02 NOTE — H&P (Signed)
TOTAL KNEE ADMISSION H&P  Patient is being admitted for right total knee arthroplasty.  Subjective:  Chief Complaint:     Right knee primary OA / pain  HPI: Gary Aguilar, 55 y.o. male, has a history of pain and functional disability in the right knee due to arthritis and has failed non-surgical conservative treatments for greater than 12 weeks to includeNSAID's and/or analgesics, corticosteriod injections, viscosupplementation injections, use of assistive devices and activity modification.  Onset of symptoms was abrupt, starting 1+ years ago with gradually worsening course since that time. The patient noted prior procedures on the knee to include  arthroscopy and menisectomy on the right knee(s).  Patient currently rates pain in the right knee(s) at 5 out of 10 with activity. Patient has night pain, worsening of pain with activity and weight bearing, pain that interferes with activities of daily living, pain with passive range of motion, crepitus and joint swelling.  Patient has evidence of periarticular osteophytes and joint space narrowing by imaging studies. There is no active infection.   Risks, benefits and expectations were discussed with the patient.  Risks including but not limited to the risk of anesthesia, blood clots, nerve damage, blood vessel damage, failure of the prosthesis, infection and up to and including death.  Patient understand the risks, benefits and expectations and wishes to proceed with surgery.   PCP: Johny Blamer, MD  D/C Plans:      Home with HHPT  Post-op Meds:       No Rx given  Tranexamic Acid:      To be given - IV   Decadron:      Is not to be given  FYI:     ASA   Oxycodone  CPAP    Patient Active Problem List   Diagnosis Date Noted  . Tear of right meniscus as current injury 05/30/2015  . Complete rupture of rotator cuff 02/23/2014   Past Medical History  Diagnosis Date  . Hypertension   . Hypercholesteremia   . Wears glasses   . Arthritis    . Sleep apnea 2009    severe osa-uses cpap-  . Acute medial meniscal tear     right   . Complication of anesthesia     pt states he had a little trouble waking up from his last surgery    Past Surgical History  Procedure Laterality Date  . Nasal septum surgery  2006    septoplasty-turb reduction  . Elbow surgery Left 2006    tendon repair  . Colonoscopy    . Sinus exploration  2009    epitaxis-caudry-lt   . Shoulder arthroscopy with rotator cuff repair and subacromial decompression Right 02/23/2014    Procedure: RIGHT SHOULDER ARTHROSCOPY SUBACROMIAL DECOMPRESSION DISTAL CLAVICLE RESECTION ROTATOR CUFF REPAIR ;  Surgeon: Wyn Forster., MD;  Location: [facility];  Service: Orthopedics;  Laterality: Right;  . Knee arthroscopy with medial menisectomy Right 05/30/2015    Procedure: KNEE ARTHROSCOPY WITH TOTAL MEDIAL MENISECTOMY;  Surgeon: Gean Birchwood, MD;  Location: [facility];  Service: Orthopedics;  Laterality: Right;    No prescriptions prior to admission   Allergies  Allergen Reactions  . Ace Inhibitors Swelling    Social History  Substance Use Topics  . Smoking status: Former Smoker    Quit date: 02/28/1981  . Smokeless tobacco: Current User    Types: Snuff  . Alcohol Use: Yes     Comment: occasionally to daily (beer)  Review of Systems  Constitutional: Negative.   HENT: Negative.   Eyes: Negative.   Respiratory: Negative.   Cardiovascular: Negative.   Gastrointestinal: Negative.   Genitourinary: Negative.   Musculoskeletal: Positive for back pain and joint pain.  Skin: Negative.   Neurological: Negative.   Endo/Heme/Allergies: Negative.   Psychiatric/Behavioral: Negative.     Objective:  Physical Exam  Constitutional: He is oriented to person, place, and time. He appears well-developed.  HENT:  Head: Normocephalic.  Eyes: Pupils are equal, round, and reactive to light.  Neck: Neck supple. No JVD present. No  tracheal deviation present. No thyromegaly present.  Cardiovascular: Normal rate, regular rhythm, normal heart sounds and intact distal pulses.   Respiratory: Effort normal and breath sounds normal. No stridor. No respiratory distress. He has no wheezes.  GI: Soft. There is no tenderness. There is no guarding.  Musculoskeletal:       Right knee: He exhibits decreased range of motion, swelling and bony tenderness. He exhibits no ecchymosis, no deformity, no laceration and no erythema. Tenderness found.  Lymphadenopathy:    He has no cervical adenopathy.  Neurological: He is alert and oriented to person, place, and time.  Skin: Skin is warm and dry.  Psychiatric: He has a normal mood and affect.      Labs:  Estimated body mass index is 35.05 kg/(m^2) as calculated from the following:   Height as of 05/30/15: 6' (1.829 m).   Weight as of 05/30/15: 117.255 kg (258 lb 8 oz).   Imaging Review Plain radiographs demonstrate severe degenerative joint disease of the right knee(s). The bone quality appears to be good for age and reported activity level.  Assessment/Plan:  End stage arthritis, right knee   The patient history, physical examination, clinical judgment of the provider and imaging studies are consistent with end stage degenerative joint disease of the right knee(s) and total knee arthroplasty is deemed medically necessary. The treatment options including medical management, injection therapy arthroscopy and arthroplasty were discussed at length. The risks and benefits of total knee arthroplasty were presented and reviewed. The risks due to aseptic loosening, infection, stiffness, patella tracking problems, thromboembolic complications and other imponderables were discussed. The patient acknowledged the explanation, agreed to proceed with the plan and consent was signed. Patient is being admitted for inpatient treatment for surgery, pain control, PT, OT, prophylactic antibiotics, VTE  prophylaxis, progressive ambulation and ADL's and discharge planning. The patient is planning to be discharged home with home health services.     Anastasio AuerbachMatthew S. Smayan Hackbart   PA-C  03/02/2016, 9:44 PM

## 2016-03-10 MED ORDER — DEXTROSE 5 % IV SOLN
3.0000 g | INTRAVENOUS | Status: AC
  Administered 2016-03-11: 3 g via INTRAVENOUS
  Filled 2016-03-10: qty 3

## 2016-03-11 ENCOUNTER — Inpatient Hospital Stay (HOSPITAL_COMMUNITY): Payer: Worker's Compensation | Admitting: Certified Registered"

## 2016-03-11 ENCOUNTER — Encounter (HOSPITAL_COMMUNITY): Payer: Self-pay | Admitting: *Deleted

## 2016-03-11 ENCOUNTER — Encounter (HOSPITAL_COMMUNITY)
Admission: RE | Disposition: A | Discharge status: Home Health | Payer: Self-pay | Source: Ambulatory Visit | Attending: Orthopedic Surgery

## 2016-03-11 ENCOUNTER — Inpatient Hospital Stay (HOSPITAL_COMMUNITY)
Admission: RE | Admit: 2016-03-11 | Discharge: 2016-03-12 | DRG: 470 | Disposition: A | Discharge status: Home Health | Payer: Worker's Compensation | Source: Ambulatory Visit | Attending: Orthopedic Surgery | Admitting: Orthopedic Surgery

## 2016-03-11 DIAGNOSIS — M25561 Pain in right knee: Secondary | ICD-10-CM | POA: Diagnosis present

## 2016-03-11 DIAGNOSIS — E7800 Pure hypercholesterolemia, unspecified: Secondary | ICD-10-CM | POA: Diagnosis present

## 2016-03-11 DIAGNOSIS — M659 Synovitis and tenosynovitis, unspecified: Secondary | ICD-10-CM | POA: Diagnosis present

## 2016-03-11 DIAGNOSIS — E669 Obesity, unspecified: Secondary | ICD-10-CM | POA: Diagnosis present

## 2016-03-11 DIAGNOSIS — Z6834 Body mass index (BMI) 34.0-34.9, adult: Secondary | ICD-10-CM | POA: Diagnosis not present

## 2016-03-11 DIAGNOSIS — Z888 Allergy status to other drugs, medicaments and biological substances status: Secondary | ICD-10-CM

## 2016-03-11 DIAGNOSIS — G4733 Obstructive sleep apnea (adult) (pediatric): Secondary | ICD-10-CM | POA: Diagnosis present

## 2016-03-11 DIAGNOSIS — M1711 Unilateral primary osteoarthritis, right knee: Principal | ICD-10-CM | POA: Diagnosis present

## 2016-03-11 DIAGNOSIS — I10 Essential (primary) hypertension: Secondary | ICD-10-CM | POA: Diagnosis present

## 2016-03-11 DIAGNOSIS — Z96659 Presence of unspecified artificial knee joint: Secondary | ICD-10-CM

## 2016-03-11 HISTORY — PX: TOTAL KNEE ARTHROPLASTY: SHX125

## 2016-03-11 SURGERY — ARTHROPLASTY, KNEE, TOTAL
Anesthesia: General | Site: Knee | Laterality: Right

## 2016-03-11 MED ORDER — BUPIVACAINE-EPINEPHRINE (PF) 0.25% -1:200000 IJ SOLN
INTRAMUSCULAR | Status: AC
  Filled 2016-03-11: qty 30

## 2016-03-11 MED ORDER — HYDROMORPHONE HCL 1 MG/ML IJ SOLN
INTRAMUSCULAR | Status: AC
  Filled 2016-03-11: qty 1

## 2016-03-11 MED ORDER — BUPIVACAINE-EPINEPHRINE (PF) 0.25% -1:200000 IJ SOLN
INTRAMUSCULAR | Status: DC | PRN
  Administered 2016-03-11: 30 mL

## 2016-03-11 MED ORDER — FENTANYL CITRATE (PF) 100 MCG/2ML IJ SOLN
INTRAMUSCULAR | Status: DC | PRN
  Administered 2016-03-11 (×6): 50 ug via INTRAVENOUS

## 2016-03-11 MED ORDER — PHENOL 1.4 % MT LIQD
1.0000 | OROMUCOSAL | Status: DC | PRN
Start: 2016-03-11 — End: 2016-03-12

## 2016-03-11 MED ORDER — KETOROLAC TROMETHAMINE 30 MG/ML IJ SOLN
INTRAMUSCULAR | Status: AC
  Filled 2016-03-11: qty 1

## 2016-03-11 MED ORDER — ACETAMINOPHEN 650 MG RE SUPP: 650.0000 mg | Freq: Four times a day (QID) | RECTAL | Status: DC | PRN

## 2016-03-11 MED ORDER — MIDAZOLAM HCL 2 MG/2ML IJ SOLN
INTRAMUSCULAR | Status: AC
  Filled 2016-03-11: qty 2

## 2016-03-11 MED ORDER — METOCLOPRAMIDE HCL 5 MG PO TABS: 5.0000 mg | ORAL_TABLET | Freq: Three times a day (TID) | ORAL | Status: DC | PRN

## 2016-03-11 MED ORDER — CEFAZOLIN SODIUM-DEXTROSE 2-4 GM/100ML-% IV SOLN
2.0000 g | Freq: Four times a day (QID) | INTRAVENOUS | Status: AC
  Administered 2016-03-11 (×2): 2 g via INTRAVENOUS
  Filled 2016-03-11 (×2): qty 100

## 2016-03-11 MED ORDER — FERROUS SULFATE 325 (65 FE) MG PO TABS
325.0000 mg | ORAL_TABLET | Freq: Three times a day (TID) | ORAL | Status: DC
  Administered 2016-03-11 – 2016-03-12 (×3): 325 mg via ORAL
  Filled 2016-03-11 (×3): qty 1

## 2016-03-11 MED ORDER — MENTHOL 3 MG MT LOZG: 1.0000 | LOZENGE | OROMUCOSAL | Status: DC | PRN

## 2016-03-11 MED ORDER — PROPOFOL 10 MG/ML IV BOLUS
INTRAVENOUS | Status: AC
  Filled 2016-03-11: qty 40

## 2016-03-11 MED ORDER — SODIUM CHLORIDE 0.9 % IV SOLN
INTRAVENOUS | Status: DC
  Administered 2016-03-11 – 2016-03-12 (×2): via INTRAVENOUS
  Filled 2016-03-11 (×5): qty 1000

## 2016-03-11 MED ORDER — TRANEXAMIC ACID 1000 MG/10ML IV SOLN
1000.0000 mg | Freq: Once | INTRAVENOUS | Status: AC
  Administered 2016-03-11: 1000 mg via INTRAVENOUS
  Filled 2016-03-11: qty 10

## 2016-03-11 MED ORDER — LIDOCAINE HCL (CARDIAC) 20 MG/ML IV SOLN
INTRAVENOUS | Status: DC | PRN
  Administered 2016-03-11: 100 mg via INTRAVENOUS

## 2016-03-11 MED ORDER — DIPHENHYDRAMINE HCL 25 MG PO CAPS
25.0000 mg | ORAL_CAPSULE | Freq: Four times a day (QID) | ORAL | Status: DC | PRN
  Administered 2016-03-12: 25 mg via ORAL
  Filled 2016-03-11: qty 1

## 2016-03-11 MED ORDER — DEXAMETHASONE SODIUM PHOSPHATE 10 MG/ML IJ SOLN
INTRAMUSCULAR | Status: DC | PRN
  Administered 2016-03-11: 10 mg via INTRAVENOUS

## 2016-03-11 MED ORDER — DOCUSATE SODIUM 100 MG PO CAPS
100.0000 mg | ORAL_CAPSULE | Freq: Two times a day (BID) | ORAL | Status: DC
  Administered 2016-03-11 – 2016-03-12 (×2): 100 mg via ORAL
  Filled 2016-03-11 (×2): qty 1

## 2016-03-11 MED ORDER — BUPIVACAINE-EPINEPHRINE (PF) 0.5% -1:200000 IJ SOLN
INTRAMUSCULAR | Status: AC
  Filled 2016-03-11: qty 30

## 2016-03-11 MED ORDER — LIDOCAINE HCL (CARDIAC) 20 MG/ML IV SOLN
INTRAVENOUS | Status: AC
  Filled 2016-03-11: qty 5

## 2016-03-11 MED ORDER — KETOROLAC TROMETHAMINE 30 MG/ML IJ SOLN
INTRAMUSCULAR | Status: DC | PRN
  Administered 2016-03-11: 30 mg

## 2016-03-11 MED ORDER — CHLORHEXIDINE GLUCONATE 4 % EX LIQD: 60.0000 mL | Freq: Once | CUTANEOUS | Status: DC

## 2016-03-11 MED ORDER — METHOCARBAMOL 500 MG PO TABS
500.0000 mg | ORAL_TABLET | Freq: Four times a day (QID) | ORAL | Status: DC | PRN
  Administered 2016-03-12: 500 mg via ORAL
  Filled 2016-03-11: qty 1

## 2016-03-11 MED ORDER — DEXAMETHASONE SODIUM PHOSPHATE 10 MG/ML IJ SOLN
INTRAMUSCULAR | Status: AC
  Filled 2016-03-11: qty 1

## 2016-03-11 MED ORDER — PHENYLEPHRINE HCL 10 MG/ML IJ SOLN
INTRAMUSCULAR | Status: AC
  Filled 2016-03-11: qty 1

## 2016-03-11 MED ORDER — FENTANYL CITRATE (PF) 100 MCG/2ML IJ SOLN
INTRAMUSCULAR | Status: AC
  Filled 2016-03-11: qty 2

## 2016-03-11 MED ORDER — MIDAZOLAM HCL 5 MG/5ML IJ SOLN
INTRAMUSCULAR | Status: DC | PRN
  Administered 2016-03-11: 2 mg via INTRAVENOUS

## 2016-03-11 MED ORDER — 0.9 % SODIUM CHLORIDE (POUR BTL) OPTIME
TOPICAL | Status: DC | PRN
  Administered 2016-03-11: 1000 mL

## 2016-03-11 MED ORDER — METOCLOPRAMIDE HCL 5 MG/ML IJ SOLN: 5.0000 mg | Freq: Three times a day (TID) | INTRAMUSCULAR | Status: DC | PRN

## 2016-03-11 MED ORDER — SODIUM CHLORIDE 0.9 % IJ SOLN
INTRAMUSCULAR | Status: AC
  Filled 2016-03-11: qty 50

## 2016-03-11 MED ORDER — HYDROCHLOROTHIAZIDE 25 MG PO TABS
25.0000 mg | ORAL_TABLET | Freq: Every day | ORAL | Status: DC
  Administered 2016-03-11 – 2016-03-12 (×2): 25 mg via ORAL
  Filled 2016-03-11 (×2): qty 1

## 2016-03-11 MED ORDER — ONDANSETRON HCL 4 MG/2ML IJ SOLN
INTRAMUSCULAR | Status: AC
  Filled 2016-03-11: qty 4

## 2016-03-11 MED ORDER — LACTATED RINGERS IV SOLN
INTRAVENOUS | Status: DC | PRN
  Administered 2016-03-11 (×3): via INTRAVENOUS

## 2016-03-11 MED ORDER — SODIUM CHLORIDE 0.9 % IJ SOLN
INTRAMUSCULAR | Status: DC | PRN
  Administered 2016-03-11: 30 mL

## 2016-03-11 MED ORDER — ONDANSETRON HCL 4 MG PO TABS: 4.0000 mg | ORAL_TABLET | Freq: Four times a day (QID) | ORAL | Status: DC | PRN

## 2016-03-11 MED ORDER — SODIUM CHLORIDE 0.9 % IR SOLN
Status: DC | PRN
  Administered 2016-03-11: 1000 mL

## 2016-03-11 MED ORDER — HYDROMORPHONE HCL 1 MG/ML IJ SOLN
INTRAMUSCULAR | Status: AC
  Filled 2016-03-11: qty 2

## 2016-03-11 MED ORDER — CELECOXIB 200 MG PO CAPS
200.0000 mg | ORAL_CAPSULE | Freq: Two times a day (BID) | ORAL | Status: DC
  Administered 2016-03-11 – 2016-03-12 (×2): 200 mg via ORAL
  Filled 2016-03-11 (×2): qty 1

## 2016-03-11 MED ORDER — ATORVASTATIN CALCIUM 20 MG PO TABS
20.0000 mg | ORAL_TABLET | Freq: Every day | ORAL | Status: DC
  Administered 2016-03-11 – 2016-03-12 (×2): 20 mg via ORAL
  Filled 2016-03-11 (×2): qty 1

## 2016-03-11 MED ORDER — MAGNESIUM CITRATE PO SOLN: 1.0000 | Freq: Once | ORAL | Status: DC | PRN

## 2016-03-11 MED ORDER — ASPIRIN EC 325 MG PO TBEC
325.0000 mg | DELAYED_RELEASE_TABLET | Freq: Two times a day (BID) | ORAL | Status: DC
  Administered 2016-03-12: 325 mg via ORAL
  Filled 2016-03-11: qty 1

## 2016-03-11 MED ORDER — ONDANSETRON HCL 4 MG/2ML IJ SOLN
INTRAMUSCULAR | Status: AC
  Filled 2016-03-11: qty 6

## 2016-03-11 MED ORDER — METHOCARBAMOL 1000 MG/10ML IJ SOLN
500.0000 mg | Freq: Four times a day (QID) | INTRAMUSCULAR | Status: DC | PRN
  Administered 2016-03-11: 500 mg via INTRAVENOUS
  Filled 2016-03-11: qty 5
  Filled 2016-03-11: qty 550

## 2016-03-11 MED ORDER — PROPOFOL 10 MG/ML IV BOLUS
INTRAVENOUS | Status: DC | PRN
  Administered 2016-03-11: 30 mg via INTRAVENOUS
  Administered 2016-03-11: 200 mg via INTRAVENOUS

## 2016-03-11 MED ORDER — ONDANSETRON HCL 4 MG/2ML IJ SOLN: 4.0000 mg | Freq: Four times a day (QID) | INTRAMUSCULAR | Status: DC | PRN

## 2016-03-11 MED ORDER — POLYETHYLENE GLYCOL 3350 17 G PO PACK
17.0000 g | PACK | Freq: Two times a day (BID) | ORAL | Status: DC
  Administered 2016-03-11 – 2016-03-12 (×2): 17 g via ORAL
  Filled 2016-03-11 (×2): qty 1

## 2016-03-11 MED ORDER — BISACODYL 10 MG RE SUPP: 10.0000 mg | Freq: Every day | RECTAL | Status: DC | PRN

## 2016-03-11 MED ORDER — HYDROMORPHONE HCL 1 MG/ML IJ SOLN
0.5000 mg | INTRAMUSCULAR | Status: DC | PRN
  Administered 2016-03-11 – 2016-03-12 (×2): 2 mg via INTRAVENOUS
  Filled 2016-03-11 (×2): qty 2

## 2016-03-11 MED ORDER — ALUM & MAG HYDROXIDE-SIMETH 200-200-20 MG/5ML PO SUSP: 30.0000 mL | ORAL | Status: DC | PRN

## 2016-03-11 MED ORDER — ONDANSETRON HCL 4 MG/2ML IJ SOLN
INTRAMUSCULAR | Status: DC | PRN
  Administered 2016-03-11 (×2): 2 mg via INTRAVENOUS
  Administered 2016-03-11: 4 mg via INTRAVENOUS

## 2016-03-11 MED ORDER — OXYCODONE HCL 5 MG PO TABS
5.0000 mg | ORAL_TABLET | ORAL | Status: DC
  Administered 2016-03-11: 15 mg via ORAL
  Administered 2016-03-11: 10 mg via ORAL
  Administered 2016-03-11 – 2016-03-12 (×5): 15 mg via ORAL
  Filled 2016-03-11: qty 2
  Filled 2016-03-11 (×6): qty 3

## 2016-03-11 MED ORDER — HYDROMORPHONE HCL 1 MG/ML IJ SOLN
0.5000 mg | INTRAMUSCULAR | Status: AC | PRN
  Administered 2016-03-11 (×4): 0.5 mg via INTRAVENOUS

## 2016-03-11 MED ORDER — DEXAMETHASONE SODIUM PHOSPHATE 10 MG/ML IJ SOLN
10.0000 mg | Freq: Once | INTRAMUSCULAR | Status: AC
  Administered 2016-03-12: 10 mg via INTRAVENOUS
  Filled 2016-03-11: qty 1

## 2016-03-11 MED ORDER — ZOLPIDEM TARTRATE 10 MG PO TABS: 10.0000 mg | ORAL_TABLET | Freq: Every evening | ORAL | Status: DC | PRN

## 2016-03-11 MED ORDER — ONDANSETRON HCL 4 MG/2ML IJ SOLN
4.0000 mg | Freq: Once | INTRAMUSCULAR | Status: DC | PRN
Start: 2016-03-11 — End: 2016-03-11

## 2016-03-11 MED ORDER — ACETAMINOPHEN 325 MG PO TABS: 650.0000 mg | ORAL_TABLET | Freq: Four times a day (QID) | ORAL | Status: DC | PRN

## 2016-03-11 MED ORDER — HYDROMORPHONE HCL 1 MG/ML IJ SOLN
0.2500 mg | INTRAMUSCULAR | Status: DC | PRN
  Administered 2016-03-11 (×4): 0.5 mg via INTRAVENOUS

## 2016-03-11 MED ORDER — METOPROLOL SUCCINATE ER 50 MG PO TB24
100.0000 mg | ORAL_TABLET | Freq: Every day | ORAL | Status: DC
  Administered 2016-03-11 – 2016-03-12 (×2): 100 mg via ORAL
  Filled 2016-03-11 (×2): qty 2

## 2016-03-11 MED ORDER — EPHEDRINE SULFATE 50 MG/ML IJ SOLN
INTRAMUSCULAR | Status: AC
  Filled 2016-03-11: qty 1

## 2016-03-11 SURGICAL SUPPLY — 47 items
BAG DECANTER FOR FLEXI CONT (MISCELLANEOUS) IMPLANT
BAG SPEC THK2 15X12 ZIP CLS (MISCELLANEOUS)
BAG ZIPLOCK 12X15 (MISCELLANEOUS) IMPLANT
BANDAGE ACE 6X5 VEL STRL LF (GAUZE/BANDAGES/DRESSINGS) ×3 IMPLANT
BLADE SAW SGTL 13.0X1.19X90.0M (BLADE) ×3 IMPLANT
BOWL SMART MIX CTS (DISPOSABLE) ×3 IMPLANT
CAPT KNEE TOTAL 3 ATTUNE ×2 IMPLANT
CEMENT HV SMART SET (Cement) ×4 IMPLANT
CLOTH BEACON ORANGE TIMEOUT ST (SAFETY) ×3 IMPLANT
CUFF TOURN SGL QUICK 34 (TOURNIQUET CUFF) ×3
CUFF TRNQT CYL 34X4X40X1 (TOURNIQUET CUFF) ×1 IMPLANT
DECANTER SPIKE VIAL GLASS SM (MISCELLANEOUS) ×3 IMPLANT
DRAPE U-SHAPE 47X51 STRL (DRAPES) ×3 IMPLANT
DRESSING AQUACEL AG SP 3.5X10 (GAUZE/BANDAGES/DRESSINGS) ×1 IMPLANT
DRSG AQUACEL AG SP 3.5X10 (GAUZE/BANDAGES/DRESSINGS) ×3
DURAPREP 26ML APPLICATOR (WOUND CARE) ×6 IMPLANT
ELECT REM PT RETURN 9FT ADLT (ELECTROSURGICAL) ×3
ELECTRODE REM PT RTRN 9FT ADLT (ELECTROSURGICAL) ×1 IMPLANT
GLOVE BIOGEL M 7.0 STRL (GLOVE) IMPLANT
GLOVE BIOGEL PI IND STRL 7.5 (GLOVE) ×1 IMPLANT
GLOVE BIOGEL PI IND STRL 8.5 (GLOVE) ×1 IMPLANT
GLOVE BIOGEL PI INDICATOR 7.5 (GLOVE) ×12
GLOVE BIOGEL PI INDICATOR 8.5 (GLOVE) ×2
GLOVE ECLIPSE 8.0 STRL XLNG CF (GLOVE) ×5 IMPLANT
GLOVE ORTHO TXT STRL SZ7.5 (GLOVE) ×6 IMPLANT
GOWN STRL REUS W/TWL LRG LVL3 (GOWN DISPOSABLE) ×5 IMPLANT
GOWN STRL REUS W/TWL XL LVL3 (GOWN DISPOSABLE) ×5 IMPLANT
HANDPIECE INTERPULSE COAX TIP (DISPOSABLE) ×3
LIQUID BAND (GAUZE/BANDAGES/DRESSINGS) ×3 IMPLANT
MANIFOLD NEPTUNE II (INSTRUMENTS) ×3 IMPLANT
PACK TOTAL KNEE CUSTOM (KITS) ×3 IMPLANT
POSITIONER SURGICAL ARM (MISCELLANEOUS) ×3 IMPLANT
SET HNDPC FAN SPRY TIP SCT (DISPOSABLE) ×1 IMPLANT
SET PAD KNEE POSITIONER (MISCELLANEOUS) ×3 IMPLANT
SUCTION FRAZIER HANDLE 12FR (TUBING) ×2
SUCTION TUBE FRAZIER 12FR DISP (TUBING) ×1 IMPLANT
SUT MNCRL AB 4-0 PS2 18 (SUTURE) ×3 IMPLANT
SUT VIC AB 1 CT1 36 (SUTURE) ×3 IMPLANT
SUT VIC AB 2-0 CT1 27 (SUTURE) ×9
SUT VIC AB 2-0 CT1 TAPERPNT 27 (SUTURE) ×3 IMPLANT
SUT VLOC 180 0 24IN GS25 (SUTURE) ×3 IMPLANT
SYR 50ML LL SCALE MARK (SYRINGE) ×1 IMPLANT
TRAY FOLEY W/METER SILVER 14FR (SET/KITS/TRAYS/PACK) ×1 IMPLANT
TRAY FOLEY W/METER SILVER 16FR (SET/KITS/TRAYS/PACK) ×3 IMPLANT
WATER STERILE IRR 1500ML POUR (IV SOLUTION) ×3 IMPLANT
WRAP KNEE MAXI GEL POST OP (GAUZE/BANDAGES/DRESSINGS) ×3 IMPLANT
YANKAUER SUCT BULB TIP 10FT TU (MISCELLANEOUS) ×3 IMPLANT

## 2016-03-11 NOTE — Anesthesia Procedure Notes (Addendum)
Procedure Name: LMA Insertion Date/Time: 03/11/2016 10:34 AM Performed by: Army FossaPULLIAM, NICHOLAS DANE Pre-anesthesia Checklist: Patient identified, Emergency Drugs available, Suction available, Patient being monitored and Timeout performed Patient Re-evaluated:Patient Re-evaluated prior to inductionOxygen Delivery Method: Circle system utilized Preoxygenation: Pre-oxygenation with 100% oxygen Intubation Type: IV induction LMA: LMA inserted LMA Size: 5.0 Number of attempts: 1 Airway Equipment and Method: Patient positioned with wedge pillow Placement Confirmation: positive ETCO2,  CO2 detector and breath sounds checked- equal and bilateral Dental Injury: Teeth and Oropharynx as per pre-operative assessment    Anesthesia Regional Block:  Adductor canal block  Pre-Anesthetic Checklist: ,, timeout performed, Correct Patient, Correct Site, Correct Laterality, Correct Procedure, Correct Position, site marked, Risks and benefits discussed,  Surgical consent,  Pre-op evaluation,  At surgeon's request and post-op pain management  Laterality: Right  Prep: chloraprep       Needles:  Injection technique: Single-shot  Needle Type: Stimulator Needle - 80          Additional Needles:  Procedures: ultrasound guided (picture in chart) Adductor canal block Narrative:  Start time: 03/11/2016 9:30 AM End time: 03/11/2016 9:37 AM Injection made incrementally with aspirations every 5 mL.  Performed by: Personally  Anesthesiologist: Laverle HobbySMITH, Iliyah Bui  Additional Notes: Pt accepts procedure w/ risks. 25cc 0.5% Marcaine w/ epi w/o difficulty. Pt tolerated well. GES

## 2016-03-11 NOTE — Progress Notes (Signed)
Utilization review completed.  

## 2016-03-11 NOTE — Progress Notes (Signed)
PT Cancellation Note  Patient Details Name: Gary SalvoCalvin D Horsford MRN: [id number] DOB: July 01, 1961   Cancelled Treatment:    Reason Eval/Treat Not Completed: Pain and lethargy limiting ability to participate   [facility]ILLIAMS,Chaniyah Jahr 03/11/2016, 5:26 PM

## 2016-03-11 NOTE — Op Note (Signed)
NAME:  Gary Aguilar                      MEDICAL RECORD NO.:  [id number]                             FACILITY:  [facility]      PHYSICIAN:  Madlyn Frankel. Charlann Boxer, M.D.  DATE OF BIRTH:  May 05, 1961      DATE OF PROCEDURE:  03/11/2016                                     OPERATIVE REPORT         PREOPERATIVE DIAGNOSIS:  Right knee osteoarthritis.      POSTOPERATIVE DIAGNOSIS:  Right knee osteoarthritis.      FINDINGS:  The patient was noted to have complete loss of cartilage and   bone-on-bone arthritis with associated osteophytes in the medial and patellofemoral compartments of   the knee with a significant synovitis and associated effusion.      PROCEDURE:  Right total knee replacement.      COMPONENTS USED:  DePuy Attune rotating platform posterior stabilized knee   system, a size 8 femur, size 7 tibia, size 6 mm PS AOX insert, and 41 anatomic patellar   button.      SURGEON:  Madlyn Frankel. Charlann Boxer, M.D.      ASSISTANT:  Lanney Gins, PA-C.      ANESTHESIA:  General and Regional.      SPECIMENS:  None.      COMPLICATION:  None.      DRAINS:  None.  EBL: <50cc      TOURNIQUET TIME:   Total Tourniquet Time Documented: Thigh (Right) - 30 minutes Total: Thigh (Right) - 30 minutes  .      The patient was stable to the recovery room.      INDICATION FOR PROCEDURE:  Gary Aguilar is a 55 y.o. male patient of   mine.  The patient had been seen, evaluated, and treated conservatively in the   office with medication, activity modification, and injections.  The patient had   radiographic changes of bone-on-bone arthritis with endplate sclerosis and osteophytes noted.      The patient failed conservative measures including medication, injections, and activity modification, and at this point was ready for more definitive measures.   Based on the radiographic changes and failed conservative measures, the patient   decided to proceed with total knee replacement.  Risks of infection,   DVT,  component failure, need for revision surgery, postop course, and   expectations were all   discussed and reviewed.  Consent was obtained for benefit of pain   relief.      PROCEDURE IN DETAIL:  The patient was brought to the operative theater.   Once adequate anesthesia, preoperative antibiotics, 3 gm of Ancef, 1 gm of Tranexamic Acid, and 10 mg of Decadron administered, the patient was positioned supine with the right thigh tourniquet placed.  The  right lower extremity was prepped and draped in sterile fashion.  A time-   out was performed identifying the patient, planned procedure, and   extremity.      The right lower extremity was placed in the Gary Aguilar leg holder.  The leg was   exsanguinated, tourniquet elevated to 250 mmHg.  A midline incision was  made followed by median parapatellar arthrotomy.  Following initial   exposure, attention was first directed to the patella.  Precut   measurement was noted to be 28-29 mm.  I resected down to 15-16 mm and used a   41 anatomic patellar button to restore patellar height as well as cover the cut   surface.      The lug holes were drilled and a metal shim was placed to protect the   patella from retractors and saw blades.      At this point, attention was now directed to the femur.  The femoral   canal was opened with a drill, irrigated to try to prevent fat emboli.  An   intramedullary rod was passed at 5 degrees valgus, 9 mm of bone was   resected off the distal femur.  Following this resection, the tibia was   subluxated anteriorly.  Using the extramedullary guide, 2 mm of bone was resected off   the proximal medial tibia.  We confirmed the gap would be   stable medially and laterally with a size 5 mm insert as well as confirmed   the cut was perpendicular in the coronal plane, checking with an alignment rod.      Once this was done, I sized the femur to be a size 8 in the anterior-   posterior dimension, chose a standard component  based on medial and   lateral dimension.  The size 8 rotation block was then pinned in   position anterior referenced using the C-clamp to set rotation.  The   anterior, posterior, and  chamfer cuts were made without difficulty nor   notching making certain that I was along the anterior cortex to help   with flexion gap stability.      The final box cut was made off the lateral aspect of distal femur.      At this point, the tibia was sized to be a size 7, the size 7 tray was   then pinned in position through the medial third of the tubercle,   drilled, and keel punched.  Trial reduction was now carried with a 8 femur,  7 tibia, a size 5 then 6 mm insert, and the 41 anatomic patella botton.  The knee was brought to   extension, full extension with good flexion stability with the patella   tracking through the trochlea without application of pressure.  Given   all these findings the femoral lug holes were drilled and then the trial components removed.  Final components were   opened and cement was mixed.  The knee was irrigated with normal saline   solution and pulse lavage.  The synovial lining was   then injected with 30 cc of 0.25% Marcaine with epinephrine and 1 cc of Toradol plus 30 cc of NS for a   total of 61 cc.      The knee was irrigated.  Final implants were then cemented onto clean and   dried cut surfaces of bone with the knee brought to extension with a size 6 mm trial insert.      Once the cement had fully cured, the excess cement was removed   throughout the knee.  I confirmed I was satisfied with the range of   motion and stability, and the final size 6 mm PS insert was chosen.  It was   placed into the knee.      The tourniquet had been let down  at 30 minutes.  No significant   hemostasis required.  The   extensor mechanism was then reapproximated using #1 Vicryl and #0 V-lock sutures with the knee   in flexion.  The   remaining wound was closed with 2-0 Vicryl and  running 4-0 Monocryl.   The knee was cleaned, dried, dressed sterilely using Dermabond and   Aquacel dressing.  The patient was then   brought to recovery room in stable condition, tolerating the procedure   well.   Please note that Physician Assistant, Lanney GinsMatthew Babish, PA-C, was present for the entirety of the case, and was utilized for pre-operative positioning, peri-operative retractor management, general facilitation of the procedure.  He was also utilized for primary wound closure at the end of the case.              Madlyn FrankelMatthew D. Charlann Boxerlin, M.D.    03/11/2016 11:13 AM

## 2016-03-11 NOTE — Progress Notes (Signed)
Assisted{Dr. Diamantina MonksGreg Smith with right, ultrasound guided, adductor canal block. Side rails up, monitors on throughout procedure. See vital signs in flow sheet. Tolerated Procedure well.

## 2016-03-11 NOTE — Anesthesia Preprocedure Evaluation (Signed)
Anesthesia Evaluation  Patient identified by MRN, date of birth, ID band Patient awake    Reviewed: Allergy & Precautions, NPO status , Patient's Chart, lab work & pertinent test results  Airway Mallampati: I  TM Distance: >3 FB Neck ROM: Full    Dental   Pulmonary sleep apnea , former smoker,    Pulmonary exam normal        Cardiovascular hypertension, Normal cardiovascular exam     Neuro/Psych    GI/Hepatic   Endo/Other    Renal/GU      Musculoskeletal  (+) Arthritis ,   Abdominal   Peds  Hematology   Anesthesia Other Findings   Reproductive/Obstetrics                             Anesthesia Physical Anesthesia Plan  ASA: II  Anesthesia Plan: General   Post-op Pain Management:    Induction: Intravenous  Airway Management Planned: Oral ETT and LMA  Additional Equipment:   Intra-op Plan:   Post-operative Plan: Extubation in OR  Informed Consent: I have reviewed the patients History and Physical, chart, labs and discussed the procedure including the risks, benefits and alternatives for the proposed anesthesia with the patient or authorized representative who has indicated his/her understanding and acceptance.     Plan Discussed with: CRNA, Anesthesiologist and Surgeon  Anesthesia Plan Comments:         Anesthesia Quick Evaluation

## 2016-03-11 NOTE — Anesthesia Postprocedure Evaluation (Signed)
Anesthesia Post Note  Patient: Gary SalvoCalvin D Aguilar  Procedure(s) Performed: Procedure(s) (LRB): RIGHT TOTAL KNEE ARTHROPLASTY (Right)  Patient location during evaluation: PACU Anesthesia Type: General Level of consciousness: awake, oriented, sedated and patient cooperative Pain management: pain level controlled Vital Signs Assessment: post-procedure vital signs reviewed and stable Respiratory status: respiratory function stable and spontaneous breathing Cardiovascular status: blood pressure returned to baseline and stable Anesthetic complications: no    Last Vitals:  Filed Vitals:   03/11/16 1225 03/11/16 1230  BP:  151/101  Pulse: 60 69  Temp:    Resp: 13 16    Last Pain:  Filed Vitals:   03/11/16 1239  PainSc: Asleep                 [name] EDWARD

## 2016-03-11 NOTE — Discharge Instructions (Signed)

## 2016-03-11 NOTE — Transfer of Care (Signed)
Immediate Anesthesia Transfer of Care Note  Patient: Gary Aguilar  Procedure(s) Performed: Procedure(s): RIGHT TOTAL KNEE ARTHROPLASTY (Right)  Patient Location: PACU  Anesthesia Type:General  Level of Consciousness:  sedated, patient cooperative and responds to stimulation  Airway & Oxygen Therapy:Patient Spontanous Breathing and Patient connected to face mask oxgen  Post-op Assessment:  Report given to PACU RN and Post -op Vital signs reviewed and stable  Post vital signs:  Reviewed and stable  Last Vitals:  Filed Vitals:   03/11/16 0950 03/11/16 0951  BP: 142/87   Pulse: 59 68  Temp:    Resp: 11 16    Complications: No apparent anesthesia complications

## 2016-03-11 NOTE — Interval H&P Note (Signed)
History and Physical Interval Note:  03/11/2016 8:42 AM  Gary Aguilar  has presented today for surgery, with the diagnosis of right knee osteoarthritis  The various methods of treatment have been discussed with the patient and family. After consideration of risks, benefits and other options for treatment, the patient has consented to  Procedure(s): RIGHT TOTAL KNEE ARTHROPLASTY (Right) as a surgical intervention .  The patient's history has been reviewed, patient examined, no change in status, stable for surgery.  I have reviewed the patient's chart and labs.  Questions were answered to the patient's satisfaction.     Shelda PalLIN,Reighlyn Elmes D

## 2016-03-12 DIAGNOSIS — E669 Obesity, unspecified: Secondary | ICD-10-CM | POA: Diagnosis present

## 2016-03-12 LAB — BASIC METABOLIC PANEL
Anion gap: 10 (ref 5–15)
BUN: 13 mg/dL (ref 6–20)
CALCIUM: 9.1 mg/dL (ref 8.9–10.3)
CHLORIDE: 105 mmol/L (ref 101–111)
CO2: 27 mmol/L (ref 22–32)
CREATININE: 1.07 mg/dL (ref 0.61–1.24)
GFR calc non Af Amer: >60 mL/min (ref >60)
GLUCOSE: 182 mg/dL — AB (ref 65–99)
Potassium: 3.7 mmol/L (ref 3.5–5.1)
Sodium: 142 mmol/L (ref 135–145)

## 2016-03-12 LAB — CBC
HEMATOCRIT: 39 % (ref 39.0–52.0)
HEMOGLOBIN: 13 g/dL (ref 13.0–17.0)
MCH: 27.8 pg (ref 26.0–34.0)
MCHC: 33.3 g/dL (ref 30.0–36.0)
MCV: 83.3 fL (ref 78.0–100.0)
Platelets: 176 10*3/uL (ref 150–400)
RBC: 4.68 MIL/uL (ref 4.22–5.81)
RDW: 13.8 % (ref 11.5–15.5)
WBC: 12.1 10*3/uL — ABNORMAL HIGH (ref 4.0–10.5)

## 2016-03-12 MED ORDER — FERROUS SULFATE 325 (65 FE) MG PO TABS: 325.0000 mg | ORAL_TABLET | Freq: Three times a day (TID) | ORAL | Status: AC

## 2016-03-12 MED ORDER — DOCUSATE SODIUM 100 MG PO CAPS: 100.0000 mg | ORAL_CAPSULE | Freq: Two times a day (BID) | ORAL | Status: AC

## 2016-03-12 MED ORDER — ACETAMINOPHEN 325 MG PO TABS: 650.0000 mg | ORAL_TABLET | Freq: Four times a day (QID) | ORAL | Status: AC | PRN

## 2016-03-12 MED ORDER — OXYCODONE HCL 5 MG PO TABS: 5.0000 mg | ORAL_TABLET | ORAL | Status: AC | PRN

## 2016-03-12 MED ORDER — POLYETHYLENE GLYCOL 3350 17 G PO PACK: 17.0000 g | PACK | Freq: Two times a day (BID) | ORAL | Status: AC

## 2016-03-12 MED ORDER — METHOCARBAMOL 500 MG PO TABS: 500.0000 mg | ORAL_TABLET | Freq: Four times a day (QID) | ORAL | Status: AC | PRN

## 2016-03-12 MED ORDER — ASPIRIN 325 MG PO TBEC: 325.0000 mg | DELAYED_RELEASE_TABLET | Freq: Two times a day (BID) | ORAL | Status: AC

## 2016-03-12 NOTE — Addendum Note (Signed)
Addendum  created 03/12/16 [id number] by Elyn PeersSandra J Kyshon Tolliver, CRNA   Modules edited: Charges VN

## 2016-03-12 NOTE — Progress Notes (Signed)
   03/12/16 1400  PT Visit Information  Last PT Received On 03/12/16  Assistance Needed +1  History of Present Illness s/p R TKA  Subjective Data  Patient Stated Goal get back to hunting, fishing, work  Precautions  Precautions Knee  Restrictions  Other Position/Activity Restrictions WBAT  Pain Assessment  Pain Assessment 0-10  Pain Score 3  Pain Location R knee  Pain Descriptors / Indicators Sore  Pain Intervention(s) Limited activity within patient's tolerance;Monitored during session;Premedicated before session;Ice applied  Cognition  Arousal/Alertness Awake/alert  Behavior During Therapy WFL for tasks assessed/performed  Overall Cognitive Status Within Functional Limits for tasks assessed  Bed Mobility  General bed mobility comments up in recliner  Transfers  Overall transfer level Needs assistance  Equipment used Rolling walker (2 wheeled)  Transfers Sit to/from Stand  Sit to Stand Supervision  General transfer comment cues for hand placement and overall safety  Ambulation/Gait  Ambulation/Gait assistance Supervision  Ambulation Distance (Feet) 360 Feet  Assistive device Rolling walker (2 wheeled)  Gait Pattern/deviations Step-to pattern;Step-through pattern  General Gait Details cues for sequence and RW position, safety with turns  Stairs Yes  Stairs assistance Min guard  Stair Management No rails;Step to pattern;Forwards;With crutches  Number of Stairs 4  General stair comments cues fro sequence, also reviewed up/down one step with  RW as pt has multiple  stairs on a slope to enter his home  Total Joint Exercises  Ankle Circles/Pumps AROM;Both;10 reps  Quad Sets AROM;Strengthening;Both;10 reps  Heel Slides AAROM;AROM;Right;10 reps  Hip ABduction/ADduction AROM;Strengthening;Right;10 reps  Straight Leg Raises AROM;AAROM;Strengthening;Right;10 reps  Goniometric ROM ~10 to 75*  PT - End of Session  Equipment Utilized During Treatment Gait belt  Activity Tolerance  Patient tolerated treatment well  Patient left in chair;with call bell/phone within reach;with family/visitor present  PT - Assessment/Plan  PT Plan Current plan remains appropriate  PT Frequency (ACUTE ONLY) 7X/week  Follow Up Recommendations Home health PT  PT equipment Rolling walker with 5" wheels;3in1 (PT)  PT Goal Progression  Progress towards PT goals Progressing toward goals  Acute Rehab PT Goals  PT Goal Formulation With patient  Time For Goal Achievement 03/13/16  Potential to Achieve Goals Good  PT Time Calculation  PT Start Time (ACUTE ONLY) 1407  PT Stop Time (ACUTE ONLY) 1445  PT Time Calculation (min) (ACUTE ONLY) 38 min  PT General Charges  $$ ACUTE PT VISIT 1 Procedure  PT Treatments  $Gait Training 23-37 mins  $Therapeutic Exercise 8-22 mins

## 2016-03-12 NOTE — Progress Notes (Signed)
[facility]    [facility] the [facility]: RW and Commode  If patient discharges after hours, please call [phone].   Renard HamperLecretia Williamson 03/12/2016, 12:41 PM

## 2016-03-12 NOTE — Progress Notes (Signed)
Pt d/c'd home. DME delivered to room prior to d/c. Discussed home health with CM who stated that worker's comp is reviewing patient's case, and the patient could go ahead and be d/c. Patient understands all follow-up instructions and appointments. In no current distress. Patient and family feel confident about instructions and are ready to go home at this time.

## 2016-03-12 NOTE — Progress Notes (Signed)
Occupational Therapy Evaluation Patient Details Name: Gary Aguilar MRN: [id number] DOB: 12-Nov-1960 Today's Date: 03/12/2016    History of Present Illness s/p R TKA   Clinical Impression   All OT education completed and pt questions answered. No further OT needs at this time; will sign off.    Follow Up Recommendations  No OT follow up;Supervision - Intermittent    Equipment Recommendations  3 in 1 bedside comode    Recommendations for Other Services       Precautions / Restrictions Precautions Precautions: Knee Restrictions Weight Bearing Restrictions: No Other Position/Activity Restrictions: WBAT      Mobility Bed Mobility            General bed mobility comments: up in recliner  Transfers Overall transfer level: Needs assistance Equipment used: Rolling walker (2 wheeled) Transfers: Sit to/from Stand Sit to Stand: Supervision         General transfer comment: cues for hand placement    Balance                                            ADL Overall ADL's : Needs assistance/impaired Eating/Feeding: Independent;Sitting   Grooming: Wash/dry hands;Supervision/safety;Standing   Upper Body Bathing: Set up;Sitting   Lower Body Bathing: Minimal assistance;Sit to/from stand   Upper Body Dressing : Set up;Sitting   Lower Body Dressing: Minimal assistance;Sit to/from stand   Toilet Transfer: Supervision/safety;Ambulation;Regular Toilet;BSC;RW   Toileting- Clothing Manipulation and Hygiene: Supervision/safety;Sit to/from stand   Tub/ Shower Transfer: Tub transfer;Min guard;Rolling walker   Functional mobility during ADLs: Supervision/safety;Rolling walker       Vision     Perception     Praxis      Pertinent Vitals/Pain Pain Assessment: 0-10 Pain Score: 4  Pain Location: R knee Pain Descriptors / Indicators: Sore Pain Intervention(s): Limited activity within patient's tolerance;Monitored during  session;Repositioned;Ice applied     Hand Dominance     Extremity/Trunk Assessment Upper Extremity Assessment Upper Extremity Assessment: Overall WFL for tasks assessed   Lower Extremity Assessment Lower Extremity Assessment: Defer to PT evaluation        Communication Communication Communication: No difficulties   Cognition Arousal/Alertness: Awake/alert Behavior During Therapy: WFL for tasks assessed/performed Overall Cognitive Status: Within Functional Limits for tasks assessed                     General Comments       Exercises       Shoulder Instructions      Home Living Family/patient expects to be discharged to:: Private residence Living Arrangements: Spouse/significant other Available Help at Discharge: Family Type of Home: House Home Access: Stairs to enter Entergy CorporationEntrance Stairs-Number of Steps: about 4   Home Layout: Two level;Laundry or work area in basement;Able to live on main level with bedroom/bathroom     Bathroom Shower/Tub: Chief Strategy OfficerTub/shower unit   Bathroom Toilet: Pharmacist, communitytandard Bathroom Accessibility: Yes How Accessible: Accessible via walker Home Equipment: Crutches   Additional Comments: 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      Prior Functioning/Environment Level of Independence: Independent             OT Diagnosis: Acute pain   OT Problem List: Decreased strength;Decreased range of motion;Decreased activity tolerance;Decreased knowledge of use of DME or AE;Pain   OT Treatment/Interventions:      OT Goals(Current goals can be found in the care  plan section) Acute Rehab OT Goals Patient Stated Goal: get back to hunting, fishing, work OT Goal Formulation: All assessment and education complete, DC therapy  OT Frequency:     Barriers to D/C:            Co-evaluation              End of Session Equipment Utilized During Treatment: Engineer, water Communication: Mobility status  Activity Tolerance: Patient tolerated treatment  well Patient left: in chair;with call bell/phone within reach   Time: 1020-1044 OT Time Calculation (min): 24 min Charges:  OT General Charges $OT Visit: 1 Procedure OT Evaluation $OT Eval Low Complexity: 1 Procedure OT Treatments $Self Care/Home Management : 8-22 mins G-Codes:    Zadin Lange A 03/13/16, 12:22 PM

## 2016-03-12 NOTE — Care Management Note (Signed)
Case Management Note  Patient Details  Name: Gary Aguilar MRN: [id number] Date of Birth: April 12, 1961  Subjective/Objective:       S/p Right total knee replacement             Action/Plan: Discharge planning, spoke with patient at bedside. Unable to reach claim adjuster, Fritz PickerelAndrea Bolivar. Contacted employer, they instructed my to call Oncall CM at [phone], provided them with clinicals and contact information. They will call me back in about an hour to [facility] and [facility]. [facility] to see if they can provide D[facility]. Patient aware of process. Faxed information to [phone], clinicals, orders, demographics.   Expected Discharge Date:  03/12/16               Expected Discharge Plan:  Home w Home Health Services  In-House Referral:  NA  Discharge planning Services  CM Consult  Post Acute Care Choice:  Home Health Choice offered to:  Patient  DME Arranged:  3-N-1, Walker rolling DME Agency:  Other - Comment (workers comp)  HH Arranged:  PT HH Agency:  Other - See comment (Workers Comp)  Status of Service:  Completed, signed off  Medicare Important Message Given:    Date Medicare IM Given:    Medicare IM give by:    Date Additional Medicare IM Given:    Additional Medicare Important Message give by:     If discussed at Long Length of Stay Meetings, dates discussed:    Additional Comments:  Gary Goodelleele, Gary Tumminello K, RN 03/12/2016, 10:36 AM

## 2016-03-12 NOTE — Evaluation (Signed)
Physical Therapy Evaluation Patient Details Name: Gary SalvoCalvin D Aguilar MRN: [id number] DOB: 12/18/1960 Today's Date: 03/12/2016   History of Present Illness  s/p R TKA  Clinical Impression  Pt is s/p TKA resulting in the deficits listed below (see PT Problem List).  Pt will benefit from skilled PT to increase their independence and safety with mobility to allow discharge to the venue listed below.      Follow Up Recommendations Home health PT    Equipment Recommendations  Rolling walker with 5" wheels;3in1 (PT)    Recommendations for Other Services       Precautions / Restrictions Precautions Precautions: Knee Restrictions Weight Bearing Restrictions: No Other Position/Activity Restrictions: WBAT      Mobility  Bed Mobility Overal bed mobility: Needs Assistance Bed Mobility: Supine to Sit     Supine to sit: Supervision     General bed mobility comments: for safety  Transfers Overall transfer level: Needs assistance Equipment used: Rolling walker (2 wheeled) Transfers: Sit to/from Stand Sit to Stand: Min guard         General transfer comment: cues for hand placement  Ambulation/Gait Ambulation/Gait assistance: Min guard Ambulation Distance (Feet): 160 Feet Assistive device: Rolling walker (2 wheeled) Gait Pattern/deviations: Step-to pattern;Antalgic     General Gait Details: cues for sequence and RW position, safety with turns  Stairs            Wheelchair Mobility    Modified Rankin (Stroke Patients Only)       Balance                                             Pertinent Vitals/Pain Pain Assessment: 0-10 Pain Score: 4  Pain Location: right knee Pain Descriptors / Indicators: Sore Pain Intervention(s): Limited activity within patient's tolerance;Monitored during session;Premedicated before session    Home Living Family/patient expects to be discharged to:: Private residence Living Arrangements: Spouse/significant  other Available Help at Discharge: Family Type of Home: House Home Access: Stairs to enter   [employer] CorporationEntrance Stairs-Number of Steps: about 4 Home Layout: Two level;Laundry or work area in basement;Able to live on main level with bedroom/bathroom Home Equipment: Crutches Additional Comments: 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    Prior Function Level of Independence: Independent               Higher education careers adviserHand Dominance        Extremity/Trunk Assessment   Upper Extremity Assessment: Defer to OT evaluation           Lower Extremity Assessment: RLE deficits/detail RLE Deficits / Details: ankle WFL; knee 10 to 65* flexion; strength >3/5       Communication   Communication: No difficulties  Cognition Arousal/Alertness: Awake/alert Behavior During Therapy: WFL for tasks assessed/performed Overall Cognitive Status: Within Functional Limits for tasks assessed                      General Comments      Exercises Total Joint Exercises Knee Flexion: AROM;Right;5 reps;Seated      Assessment/Plan    PT Assessment Patient needs continued PT services  PT Diagnosis Difficulty walking   PT Problem List Decreased strength;Decreased activity tolerance;Decreased mobility;Decreased range of motion;Decreased knowledge of use of DME;Decreased knowledge of precautions  PT Treatment Interventions DME instruction;Gait training;Stair training;Functional mobility training;Therapeutic activities;Therapeutic exercise;Patient/family education   PT Goals (Current goals can be  found in the Care Plan section) Acute Rehab PT Goals Patient Stated Goal: get back to hunting, fishing, work PT Goal Formulation: With patient Time For Goal Achievement: 03/13/16 Potential to Achieve Goals: Good    Frequency 7X/week   Barriers to discharge        Co-evaluation               End of Session   Activity Tolerance: Patient tolerated treatment well Patient left: in chair;with call bell/phone within  reach;with chair alarm set (with OT present)           Time: 7829-5621 PT Time Calculation (min) (ACUTE ONLY): 18 min   Charges:   PT Evaluation $PT Eval Low Complexity: 1 Procedure     PT G Codes:        Gary Aguilar 11-Apr-2016, 11:25 AM

## 2016-03-12 NOTE — Progress Notes (Signed)
     Subjective: 1 Day Post-Op Procedure(s) (LRB): RIGHT TOTAL KNEE ARTHROPLASTY (Right)   Patient reports pain as moderate, pain controlled.  Didn't sleep at all last night, otherwise no events throughout the night. Ready to be discharged home if he does well with PT.  Objective:   VITALS:   Filed Vitals:   03/12/16 0131 03/12/16 0434  BP: 135/75 155/81  Pulse: 85 63  Temp: 98.5 F (36.9 C)   Resp: 16 16    Dorsiflexion/Plantar flexion intact Incision: dressing C/D/I No cellulitis present Compartment soft  LABS  Recent Labs  03/12/16 0409  HGB 13.0  HCT 39.0  WBC 12.1*  PLT 176     Recent Labs  03/12/16 0409  NA 142  K 3.7  BUN 13  CREATININE 1.07  GLUCOSE 182*     Assessment/Plan: 1 Day Post-Op Procedure(s) (LRB): RIGHT TOTAL KNEE ARTHROPLASTY (Right) Foley cath d/c'ed Advance diet Up with therapy D/C IV fluids Discharge home with home health  [facility] Follow up with OLIN,Adriel Kessen D in 2 weeks.  Contact information:  [facility] [address] [place] [id number] [phone]    Obese (BMI 30-39.9) Estimated body mass index is 34.31 kg/(m^2) as calculated from the following:   Height as of this encounter: 6' (1.829 m).   Weight as of this encounter: 114.76 kg (253 lb). Patient also counseled that weight may inhibit the healing process Patient counseled that losing weight will help with future health issues         Anastasio AuerbachMatthew S. Messiah Rovira   PAC  03/12/2016, 9:26 AM

## 2016-03-15 ENCOUNTER — Emergency Department (HOSPITAL_COMMUNITY)
Admission: EM | Admit: 2016-03-15 | Discharge: 2016-03-16 | Disposition: A | Discharge status: Home / Self Care | Payer: Worker's Compensation | Attending: Emergency Medicine | Admitting: Emergency Medicine

## 2016-03-15 ENCOUNTER — Encounter (HOSPITAL_COMMUNITY): Payer: Self-pay | Admitting: Emergency Medicine

## 2016-03-15 DIAGNOSIS — Z87828 Personal history of other (healed) physical injury and trauma: Secondary | ICD-10-CM | POA: Diagnosis not present

## 2016-03-15 DIAGNOSIS — Z973 Presence of spectacles and contact lenses: Secondary | ICD-10-CM | POA: Diagnosis not present

## 2016-03-15 DIAGNOSIS — M96840 Postprocedural hematoma of a musculoskeletal structure following a musculoskeletal system procedure: Secondary | ICD-10-CM | POA: Insufficient documentation

## 2016-03-15 DIAGNOSIS — I10 Essential (primary) hypertension: Secondary | ICD-10-CM | POA: Insufficient documentation

## 2016-03-15 DIAGNOSIS — Z87891 Personal history of nicotine dependence: Secondary | ICD-10-CM | POA: Diagnosis not present

## 2016-03-15 DIAGNOSIS — G8918 Other acute postprocedural pain: Secondary | ICD-10-CM | POA: Diagnosis not present

## 2016-03-15 DIAGNOSIS — Z7982 Long term (current) use of aspirin: Secondary | ICD-10-CM | POA: Diagnosis not present

## 2016-03-15 DIAGNOSIS — Z79899 Other long term (current) drug therapy: Secondary | ICD-10-CM | POA: Diagnosis not present

## 2016-03-15 DIAGNOSIS — Z9981 Dependence on supplemental oxygen: Secondary | ICD-10-CM | POA: Diagnosis not present

## 2016-03-15 DIAGNOSIS — G4730 Sleep apnea, unspecified: Secondary | ICD-10-CM | POA: Insufficient documentation

## 2016-03-15 DIAGNOSIS — M25561 Pain in right knee: Secondary | ICD-10-CM | POA: Diagnosis present

## 2016-03-15 DIAGNOSIS — Z5189 Encounter for other specified aftercare: Secondary | ICD-10-CM

## 2016-03-15 DIAGNOSIS — M1990 Unspecified osteoarthritis, unspecified site: Secondary | ICD-10-CM | POA: Insufficient documentation

## 2016-03-15 DIAGNOSIS — Z96651 Presence of right artificial knee joint: Secondary | ICD-10-CM | POA: Insufficient documentation

## 2016-03-15 DIAGNOSIS — E7800 Pure hypercholesterolemia, unspecified: Secondary | ICD-10-CM | POA: Insufficient documentation

## 2016-03-15 DIAGNOSIS — Z4801 Encounter for change or removal of surgical wound dressing: Secondary | ICD-10-CM | POA: Diagnosis not present

## 2016-03-15 LAB — CBC WITH DIFFERENTIAL/PLATELET
Basophils Absolute: 0 10*3/uL (ref 0.0–0.1)
Basophils Relative: 0 %
EOS PCT: 0 %
Eosinophils Absolute: 0 10*3/uL (ref 0.0–0.7)
HCT: 36.1 % — ABNORMAL LOW (ref 39.0–52.0)
Hemoglobin: 12.2 g/dL — ABNORMAL LOW (ref 13.0–17.0)
LYMPHS ABS: 1.2 10*3/uL (ref 0.7–4.0)
LYMPHS PCT: 11 %
MCH: 27.6 pg (ref 26.0–34.0)
MCHC: 33.8 g/dL (ref 30.0–36.0)
MCV: 81.7 fL (ref 78.0–100.0)
MONO ABS: 1 10*3/uL (ref 0.1–1.0)
MONOS PCT: 9 %
Neutro Abs: 9 10*3/uL — ABNORMAL HIGH (ref 1.7–7.7)
Neutrophils Relative %: 80 %
PLATELETS: 193 10*3/uL (ref 150–400)
RBC: 4.42 MIL/uL (ref 4.22–5.81)
RDW: 13.3 % (ref 11.5–15.5)
WBC: 11.3 10*3/uL — ABNORMAL HIGH (ref 4.0–10.5)

## 2016-03-15 MED ORDER — HYDROMORPHONE HCL 1 MG/ML IJ SOLN
1.0000 mg | Freq: Once | INTRAMUSCULAR | Status: AC
  Administered 2016-03-15: 1 mg via INTRAVENOUS
  Filled 2016-03-15: qty 1

## 2016-03-15 NOTE — ED Provider Notes (Signed)
CSN: [id number]     Arrival date & time 03/15/16  2152 History   First MD Initiated Contact with Patient 03/15/16 2303     No chief complaint on file.    (Consider location/radiation/quality/duration/timing/severity/associated sxs/prior Treatment) HPI   Blood pressure 141/88, pulse 74, temperature 98.3 F (36.8 C), temperature source Oral, resp. rate 18, SpO2 97 %.  Gary Aguilar is a 55 y.o. male complaining of increasing swelling and persistent pain to right knee status post total knee replacement 5 days ago by Dr. Charlann Boxer, he's been performing physical therapy at home and has good range of motion, he states that the pain is severe, he rates it at 5 out of 10, he was initially taking Percocet and he was transitioned to 2 mg allotted, he takes 3 pills every 4-6 hours. He denies fever, chills, nausea, vomiting, streaking up the leg. He has not removed the dressing but feels that there is discharge from underneath the dressing, as per wife she feels that the swelling is getting slightly worse.  Past Medical History  Diagnosis Date  . Hypertension   . Hypercholesteremia   . Wears glasses   . Arthritis   . Sleep apnea 2009    severe osa-uses cpap-  . Acute medial meniscal tear     right   . Complication of anesthesia     pt states he had a little trouble waking up from his last surgery   Past Surgical History  Procedure Laterality Date  . Nasal septum surgery  2006    septoplasty-turb reduction  . Elbow surgery Left 2006    tendon repair  . Colonoscopy    . Sinus exploration  2009    epitaxis-caudry-lt   . Shoulder arthroscopy with rotator cuff repair and subacromial decompression Right 02/23/2014    Procedure: RIGHT SHOULDER ARTHROSCOPY SUBACROMIAL DECOMPRESSION DISTAL CLAVICLE RESECTION ROTATOR CUFF REPAIR ;  Surgeon: Wyn Forster., MD;  Location: [facility];  Service: Orthopedics;  Laterality: Right;  . Knee arthroscopy with medial menisectomy Right 05/30/2015     Procedure: KNEE ARTHROSCOPY WITH TOTAL MEDIAL MENISECTOMY;  Surgeon: Gean Birchwood, MD;  Location: [facility];  Service: Orthopedics;  Laterality: Right;  . Total knee arthroplasty Right 03/11/2016    Procedure: RIGHT TOTAL KNEE ARTHROPLASTY;  Surgeon: Durene Romans, MD;  Location: [facility];  Service: Orthopedics;  Laterality: Right;   No family history on file. Social History  Substance Use Topics  . Smoking status: Former Smoker    Quit date: 02/28/1981  . Smokeless tobacco: Current User    Types: Snuff  . Alcohol Use: Yes     Comment: occasionally to daily (beer)    Review of Systems  10 systems reviewed and found to be negative, except as noted in the HPI.   Allergies  Ace inhibitors  Home Medications   Prior to Admission medications   Medication Sig Start Date End Date Taking? Authorizing Provider  acetaminophen (TYLENOL) 325 MG tablet Take 2 tablets (650 mg total) by mouth every 6 (six) hours as needed for mild pain (or Fever >/= 101). 03/12/16   Lanney Gins, PA-C  aspirin EC 325 MG EC tablet Take 1 tablet (325 mg total) by mouth 2 (two) times daily. 03/12/16 04/09/16  Lanney Gins, PA-C  atorvastatin (LIPITOR) 20 MG tablet Take 20 mg by mouth daily.    Historical Provider, MD  docusate sodium (COLACE) 100 MG capsule Take 1 capsule (100 mg total) by mouth 2 (two)  times daily. 03/12/16   Lanney GinsMatthew Babish, PA-C  ferrous sulfate 325 (65 FE) MG tablet Take 1 tablet (325 mg total) by mouth 3 (three) times daily after meals. 03/12/16   Lanney GinsMatthew Babish, PA-C  hydrochlorothiazide (HYDRODIURIL) 25 MG tablet Take 25 mg by mouth daily.    Historical Provider, MD  methocarbamol (ROBAXIN) 500 MG tablet Take 1 tablet (500 mg total) by mouth every 6 (six) hours as needed for muscle spasms. 03/12/16   Lanney GinsMatthew Babish, PA-C  metoprolol succinate (TOPROL-XL) 100 MG 24 hr tablet Take 100 mg by mouth daily. Take with or immediately following a meal.    Historical Provider, MD  oxyCODONE  (OXY IR/ROXICODONE) 5 MG immediate release tablet Take 1-3 tablets (5-15 mg total) by mouth every 4 (four) hours as needed for severe pain. 03/12/16   Lanney GinsMatthew Babish, PA-C  polyethylene glycol (MIRALAX / GLYCOLAX) packet Take 17 g by mouth 2 (two) times daily. 03/12/16   Lanney GinsMatthew Babish, PA-C  zolpidem (AMBIEN) 10 MG tablet Take 10 mg by mouth at bedtime as needed for sleep.    Historical Provider, MD   BP 141/88 mmHg  Pulse 74  Temp(Src) 98.3 F (36.8 C) (Oral)  Resp 18  SpO2 97% Physical Exam  Constitutional: He is oriented to person, place, and time. He appears well-developed and well-nourished. No distress.  HENT:  Head: Normocephalic and atraumatic.  Eyes: Conjunctivae and EOM are normal. Pupils are equal, round, and reactive to light.  Neck: Normal range of motion.  Cardiovascular: Normal rate and regular rhythm.   Pulmonary/Chest: Effort normal and breath sounds normal. No stridor. No respiratory distress. He has no wheezes. He has no rales. He exhibits no tenderness.  Abdominal: Soft. Bowel sounds are normal. He exhibits no distension and no mass. There is no tenderness. There is no rebound and no guarding.  Musculoskeletal: Normal range of motion. He exhibits edema and tenderness.  Right knee with surgical dressing in place, there is significant bruising and ecchymoses, excellent active range of motion. He is distally neurovascularly intact. No significant warmth, no streaking up the leg. No significant calf tenderness.  Singh removed and wound is clean dry and intact with no discharge.  Neurological: He is alert and oriented to person, place, and time.  Psychiatric: He has a normal mood and affect.  Nursing note and vitals reviewed.         ED Course  Procedures (including critical care time) Labs Review Labs Reviewed - No data to display  Imaging Review No results found. I have personally reviewed and evaluated these images and lab results as part of my medical  decision-making.   EKG Interpretation None      MDM   Final diagnoses:  None    Filed Vitals:   03/15/16 2239 03/16/16 0056  BP: 141/88 132/72  Pulse: 74 65  Temp: 98.3 F (36.8 C)   TempSrc: Oral   Resp: 18 18  SpO2: 97% 100%    Medications  HYDROmorphone (DILAUDID) injection 1 mg (1 mg Intravenous Given 03/15/16 2346)    Gary Aguilar is 55 y.o. male presenting with Persistent pain status post right BKA 5 days ago. Wound does not appear infected. Physical exam is not consistent with DVT. Blood work reassuring. Pain improved after IV Dilaudid, case discussed with Dr. Leroy LibmanSwintech who agrees this patient is stable for discharge home, will follow with Dr. Charlann Boxerlin in 2 days.  Evaluation does not show pathology that would require ongoing emergent intervention or inpatient treatment.  Pt is hemodynamically stable and mentating appropriately. Discussed findings and plan with patient/guardian, who agrees with care plan. All questions answered. Return precautions discussed and outpatient follow up given.        Joni Reining Aarilyn Dye, PA-C 03/16/16 1610  Devoria Albe, MD 03/16/16 [phone]

## 2016-03-15 NOTE — ED Notes (Signed)
Patient presents for post op knee pain r/t total knee replacement 5/9. Wife reports drainage to bandage and increased swelling to same. No relief with pain medications. Took three 2mg  dilaudid, 325 aspirin, and robaxin approximately 2000 today. Rates pain 7/10.

## 2016-03-16 LAB — BASIC METABOLIC PANEL
Anion gap: 10 (ref 5–15)
BUN: 21 mg/dL — ABNORMAL HIGH (ref 6–20)
CHLORIDE: 101 mmol/L (ref 101–111)
CO2: 29 mmol/L (ref 22–32)
Calcium: 9.5 mg/dL (ref 8.9–10.3)
Creatinine, Ser: 0.96 mg/dL (ref 0.61–1.24)
GFR calc non Af Amer: >60 mL/min (ref >60)
Glucose, Bld: 140 mg/dL — ABNORMAL HIGH (ref 65–99)
POTASSIUM: 4 mmol/L (ref 3.5–5.1)
SODIUM: 140 mmol/L (ref 135–145)

## 2016-03-16 NOTE — ED Notes (Signed)
Pt reports understanding of discharge information. No questions at time of discharge 

## 2016-03-16 NOTE — ED Notes (Signed)
Provider at bedside to apply dressing from OR to right anterior knee.

## 2016-03-16 NOTE — Discharge Instructions (Signed)
Please call Dr. Charlann Boxerlin on Monday.   Return to the emergency department for new or worsening symptoms.  Do not hesitate to return to the Emergency Department for any new, worsening or concerning symptoms.

## 2016-03-18 NOTE — Discharge Summary (Signed)
Physician Discharge Summary  Patient ID: Gary Aguilar MRN: [id number] DOB/AGE: Jun 29, 1961 55 y.o.  Admit date: 03/11/2016 Discharge date: 03/12/2016   Procedures:  Procedure(s) (LRB): RIGHT TOTAL KNEE ARTHROPLASTY (Right)  Attending Physician:  Dr. Durene Romans   Admission Diagnoses:   Right knee primary OA / pain  Discharge Diagnoses:  Principal Problem:   S/P right TKA Active Problems:   Obese  Past Medical History  Diagnosis Date  . Hypertension   . Hypercholesteremia   . Wears glasses   . Arthritis   . Sleep apnea 2009    severe osa-uses cpap-  . Acute medial meniscal tear     right   . Complication of anesthesia     pt states he had a little trouble waking up from his last surgery    HPI:    Gary Aguilar, 55 y.o. male, has a history of pain and functional disability in the right knee due to arthritis and has failed non-surgical conservative treatments for greater than 12 weeks to includeNSAID's and/or analgesics, corticosteriod injections, viscosupplementation injections, use of assistive devices and activity modification. Onset of symptoms was abrupt, starting 1+ years ago with gradually worsening course since that time. The patient noted prior procedures on the knee to include arthroscopy and menisectomy on the right knee(s). Patient currently rates pain in the right knee(s) at 5 out of 10 with activity. Patient has night pain, worsening of pain with activity and weight bearing, pain that interferes with activities of daily living, pain with passive range of motion, crepitus and joint swelling. Patient has evidence of periarticular osteophytes and joint space narrowing by imaging studies. There is no active infection. Risks, benefits and expectations were discussed with the patient. Risks including but not limited to the risk of anesthesia, blood clots, nerve damage, blood vessel damage, failure of the prosthesis, infection and up to and including death.  Patient understand the risks, benefits and expectations and wishes to proceed with surgery.   PCP: Johny Blamer, MD   Discharged Condition: good  Hospital Course:  Patient underwent the above stated procedure on 03/11/2016. Patient tolerated the procedure well and brought to the recovery room in good condition and subsequently to the floor.  POD #1 BP: 155/81 ; Pulse: 63 ; Temp: 98.5 F (36.9 C) ; Resp: 16 Patient reports pain as moderate, pain controlled. Didn't sleep at all last night, otherwise no events throughout the night. Ready to be discharged home. Dorsiflexion/plantar flexion intact, incision: dressing C/D/I, no cellulitis present and compartment soft.   LABS  Basename    HGB     13.0  HCT     39.0    Discharge Exam: General appearance: alert, cooperative and no distress Extremities: Homans sign is negative, no sign of DVT, no edema, redness or tenderness in the calves or thighs and no ulcers, gangrene or trophic changes  Disposition: Home with follow up in 2 weeks   Follow-up Information    Follow up with Shelda Pal, MD. Schedule an appointment as soon as possible for a visit in 2 weeks.   Specialty:  Orthopedic Surgery   Contact information:   [address] [place] [address] [phone]       Discharge Instructions    Call MD / Call 911    Complete by:  As directed   If you experience chest pain or shortness of breath, CALL 911 and be transported to the hospital emergency room.  If you develope a fever  above 101 F, pus (white drainage) or increased drainage or redness at the wound, or calf pain, call your surgeon's office.     Change dressing    Complete by:  As directed   Maintain surgical dressing until follow up in the clinic. If the edges start to pull up, may reinforce with tape. If the dressing is no longer working, may remove and cover with gauze and tape, but must keep the area dry and clean.  Call with any questions or  concerns.     Constipation Prevention    Complete by:  As directed   Drink plenty of fluids.  Prune juice may be helpful.  You may use a stool softener, such as Colace (over the counter) 100 mg twice a day.  Use MiraLax (over the counter) for constipation as needed.     Diet - low sodium heart healthy    Complete by:  As directed      Discharge instructions    Complete by:  As directed   Maintain surgical dressing until follow up in the clinic. If the edges start to pull up, may reinforce with tape. If the dressing is no longer working, may remove and cover with gauze and tape, but must keep the area dry and clean.  [facility]. Call with any questions or concerns.     Increase activity slowly as tolerated    Complete by:  As directed   Weight bearing as tolerated with assist device (walker, cane, etc) as directed, use it as long as suggested by your surgeon or therapist, typically at least 4-6 weeks.     TED hose    Complete by:  As directed   Use stockings (TED hose) for 2 weeks on both leg(s).  You may remove them at night for sleeping.             Medication List    STOP taking these medications        EXCEDRIN EXTRA STRENGTH 250-250-65 MG tablet  Generic drug:  aspirin-acetaminophen-caffeine     oxyCODONE-acetaminophen 5-325 MG tablet  Commonly known as:  ROXICET      TAKE these medications        acetaminophen 325 MG tablet  Commonly known as:  TYLENOL  Take 2 tablets (650 mg total) by mouth every 6 (six) hours as needed for mild pain (or Fever >/= 101).     aspirin 325 MG EC tablet  Take 1 tablet (325 mg total) by mouth 2 (two) times daily.     atorvastatin 20 MG tablet  Commonly known as:  LIPITOR  Take 20 mg by mouth daily.     docusate sodium 100 MG capsule  Commonly known as:  COLACE  Take 1 capsule (100 mg total) by mouth 2 (two) times daily.     ferrous sulfate 325 (65 FE) MG tablet  Take 1 tablet (325 mg total) by  mouth 3 (three) times daily after meals.     hydrochlorothiazide 25 MG tablet  Commonly known as:  HYDRODIURIL  Take 25 mg by mouth daily.     methocarbamol 500 MG tablet  Commonly known as:  ROBAXIN  Take 1 tablet (500 mg total) by mouth every 6 (six) hours as needed for muscle spasms.     metoprolol succinate 100 MG 24 hr tablet  Commonly known as:  TOPROL-XL  Take 100 mg by mouth daily. Take with or immediately following a meal.  oxyCODONE 5 MG immediate release tablet  Commonly known as:  Oxy IR/ROXICODONE  Take 1-3 tablets (5-15 mg total) by mouth every 4 (four) hours as needed for severe pain.     polyethylene glycol packet  Commonly known as:  MIRALAX / GLYCOLAX  Take 17 g by mouth 2 (two) times daily.     zolpidem 10 MG tablet  Commonly known as:  AMBIEN  Take 10 mg by mouth at bedtime as needed for sleep.         Signed: Anastasio Auerbach. Halima Fogal   PA-C  03/18/2016, 1:31 PM

## 2017-02-09 ENCOUNTER — Encounter (INDEPENDENT_AMBULATORY_CARE_PROVIDER_SITE_OTHER): Payer: Managed Care, Other (non HMO) | Admitting: Ophthalmology

## 2017-02-09 DIAGNOSIS — I10 Essential (primary) hypertension: Secondary | ICD-10-CM

## 2017-02-09 DIAGNOSIS — H2513 Age-related nuclear cataract, bilateral: Secondary | ICD-10-CM | POA: Diagnosis not present

## 2017-02-09 DIAGNOSIS — H35033 Hypertensive retinopathy, bilateral: Secondary | ICD-10-CM

## 2017-02-09 DIAGNOSIS — H43813 Vitreous degeneration, bilateral: Secondary | ICD-10-CM | POA: Diagnosis not present

## 2017-02-09 DIAGNOSIS — H35412 Lattice degeneration of retina, left eye: Secondary | ICD-10-CM | POA: Diagnosis not present

## 2017-02-09 DIAGNOSIS — H33302 Unspecified retinal break, left eye: Secondary | ICD-10-CM

## 2017-02-24 ENCOUNTER — Ambulatory Visit (INDEPENDENT_AMBULATORY_CARE_PROVIDER_SITE_OTHER): Payer: Managed Care, Other (non HMO) | Admitting: Ophthalmology

## 2017-06-18 ENCOUNTER — Ambulatory Visit: Payer: Self-pay | Admitting: Registered"

## 2017-06-22 ENCOUNTER — Encounter: Payer: Managed Care, Other (non HMO) | Attending: Family Medicine | Admitting: Registered"

## 2017-06-22 ENCOUNTER — Encounter: Payer: Self-pay | Admitting: Registered"

## 2017-06-22 DIAGNOSIS — Z713 Dietary counseling and surveillance: Secondary | ICD-10-CM | POA: Diagnosis not present

## 2017-06-22 DIAGNOSIS — E119 Type 2 diabetes mellitus without complications: Secondary | ICD-10-CM | POA: Diagnosis present

## 2017-06-22 NOTE — Progress Notes (Signed)
Diabetes Self-Management Education  Visit Type: First/Initial  Appt. Start Time: 1135 Appt. End Time: 1240  06/22/2017  Mr. Gary Aguilar, identified by name and date of birth, is a 56 y.o. male with a diagnosis of Diabetes: Type 2.   ASSESSMENT Pt is here with sister. 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 Pt states he has lost 26 lbs since July. Main changes to diet is no rice, white potatoes, corn, nothing fried, breaded. Pt is motivated to have good health going into retirement so he can enjoy retirement.  Pt states they are also trying to help other family members who are having health issues. RD did not have time to discuss role of stress on BG control.     Diabetes Self-Management Education - 06/22/17 1140      Visit Information   Visit Type First/Initial     Initial Visit   Diabetes Type Type 2   Are you currently following a meal plan? Yes   What type of meal plan do you follow? modified. small portions, low carb   Are you taking your medications as prescribed? Yes   Date Diagnosed July 2018     Health Coping   How would you rate your overall health? Fair     Psychosocial Assessment   Patient Belief/Attitude about Diabetes Motivated to manage diabetes   How often do you need to have someone help you when you read instructions, pamphlets, or other written materials from your doctor or pharmacy? 1 - Never   What is the last grade level you completed in school? 14     Complications   Last HgB A1C per patient/outside source 6.4 %   How often do you check your blood sugar? 3-4 times/day   Fasting Blood glucose range (mg/dL) 23-762   Postprandial Blood glucose range (mg/dL) 831-517  616 - always in the green   Number of hypoglycemic episodes per month 2   Can you tell when your blood sugar is low? Yes   What do you do if  your blood sugar is low? ate PB cracker, then ate lunch   Number of hyperglycemic episodes per week 1   Have you had a dilated eye exam in the past 12 months? Yes   Have you had a dental exam in the past 12 months? Yes   Are you checking your feet? No     Dietary Intake   Breakfast fruit, herbalife shake, herbal tea OR eggs (white or boiled) OR omelette, meat, spinach   Snack (morning) none   Lunch salad, chicken OR tuna on lettuce bed & greek salad OR wings, salad   Snack (afternoon) none   Dinner (late eater lunch at 3) meat, vegetables, sweet potato   Snack (evening) none   Beverage(s) water, black coffee, unsweet tea     Exercise   Exercise Type ADL's   How many days per week to you exercise? 0   How many minutes per day do you exercise? 0   Total minutes per week of exercise 0     Patient Education   Previous Diabetes Education Yes (please comment)  July 2018   Disease state  Definition of diabetes, type 1 and 2, and the diagnosis of diabetes;Factors that contribute to the development of diabetes   Nutrition management  Role of diet in the treatment of diabetes and the relationship between the three main macronutrients and blood glucose level;Food label reading, portion sizes and measuring food.;Carbohydrate counting     Individualized Goals (developed by patient)   Nutrition Follow meal plan discussed     Outcomes   Expected Outcomes Demonstrated interest in learning. Expect positive outcomes   Future DMSE 4-6 wks   Program Status Not Completed    Individualized Plan for Diabetes Self-Management Training:  Learning Objective:  Patient will have a greater understanding of diabetes self-management. Patient education plan is to attend individual and/or group sessions per assessed needs and concerns.  Patient Instructions  Plan:  Aim for 3-4 Carb Choices per meal (45-60 grams)  Aim for 0-2 Carbs per snack if hungry  Include protein in moderation with your meals and  snacks Consider reading food labels for Total Carbohydrate Consider increasing your activity daily as tolerated Continue checking BG at alternate times per day as directed by MD  Continue taking medication as directed by MD Include 1-2 handfuls of nuts in your diet   Expected Outcomes:  Demonstrated interest in learning. Expect positive outcomes  Education material provided: Living Well with Diabetes, My Plate, Snack sheet and Carbohydrate counting sheet  If problems or questions, patient to contact team via:  Phone and MyChart  Future DSME appointment: 4-6 wks

## 2017-06-22 NOTE — Patient Instructions (Addendum)
Plan:  Aim for 3-4 Carb Choices per meal (45-60 grams)  Aim for 0-2 Carbs per snack if hungry  Include protein in moderation with your meals and snacks Consider reading food labels for Total Carbohydrate Consider increasing your activity daily as tolerated Continue checking BG at alternate times per day as directed by MD  Continue taking medication as directed by MD Include 1-2 handfuls of nuts in your diet

## 2017-07-27 ENCOUNTER — Ambulatory Visit: Payer: Self-pay | Admitting: Registered"

## 2021-12-17 ENCOUNTER — Ambulatory Visit: Payer: 59 | Admitting: Dermatology

## 2023-12-04 ENCOUNTER — Encounter: Payer: Self-pay | Admitting: Emergency Medicine

## 2023-12-04 ENCOUNTER — Ambulatory Visit
Admission: EM | Admit: 2023-12-04 | Discharge: 2023-12-04 | Disposition: A | Discharge status: Home / Self Care | Payer: Commercial Managed Care - HMO

## 2023-12-04 DIAGNOSIS — J209 Acute bronchitis, unspecified: Secondary | ICD-10-CM

## 2023-12-04 MED ORDER — AZITHROMYCIN 250 MG PO TABS: 250.0000 mg | ORAL_TABLET | Freq: Every day | ORAL | 0 refills | Status: AC

## 2023-12-04 MED ORDER — PREDNISONE 20 MG PO TABS: 40.0000 mg | ORAL_TABLET | Freq: Every day | ORAL | 0 refills | Status: AC

## 2023-12-04 NOTE — ED Provider Notes (Signed)
[facility]    CSN: [id number] Arrival date & time: 12/04/23  0810      History   Chief Complaint Chief Complaint  Patient presents with   Cough    HPI Gary Aguilar is a 63 y.o. male.   Patient here today for evaluation of nagging productive cough has had for 2 weeks.  He states this happens about once a year.  He denies feeling poorly but states cough is more of a nuisance.  He reports he is having trouble sleeping due to cough.  He has not had any fever.  He has tried using cough drops with mild relief.  The history is provided by the patient.  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   Tear of right meniscus as current injury 05/30/2015   Complete rupture of rotator cuff 02/23/2014    Past Surgical History:  Procedure Laterality Date   COLONOSCOPY     ELBOW SURGERY Left 2006   tendon repair   KNEE ARTHROSCOPY WITH MEDIAL MENISECTOMY Right 05/30/2015   Procedure: KNEE ARTHROSCOPY WITH TOTAL MEDIAL MENISECTOMY;  Surgeon: Gean Birchwood, MD;  Location: [facility];  Service: Orthopedics;  Laterality: Right;   NASAL SEPTUM SURGERY  2006   septoplasty-turb reduction   SHOULDER ARTHROSCOPY WITH ROTATOR CUFF REPAIR AND SUBACROMIAL DECOMPRESSION Right 02/23/2014   Procedure: RIGHT SHOULDER ARTHROSCOPY SUBACROMIAL DECOMPRESSION DISTAL CLAVICLE RESECTION ROTATOR CUFF REPAIR ;  Surgeon: Wyn Forster., MD;  Location: [facility];  Service: Orthopedics;   Laterality: Right;   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 Authorizing Provider  acetaminophen (TYLENOL) 325 MG tablet Take by mouth. 09/16/19   [provider]  amLODipine (NORVASC) 5 MG tablet Take 1 tablet by mouth daily. Patient not taking: Reported on 12/04/2023 02/01/21   [provider]  amoxicillin-clavulanate (AUGMENTIN) 875-125 MG tablet 1 tablet Orally every 12 hrs for 10 day(s) Patient not taking: Reported on 12/04/2023 02/16/23   [provider]  aspirin EC 81 MG tablet Take by mouth. Patient not taking: Reported on 12/04/2023 06/07/20   [provider]  azithromycin (ZITHROMAX) 250 MG tablet Take 1 tablet (250 mg total) by mouth daily. Take first 2 tablets together, then 1 every day until finished. 12/04/23  Yes Tomi Bamberger, PA-C  colchicine 0.6 MG tablet 1 tablet Orally Twice a day as needed for gout for 15 days 02/19/23   [provider]  cyclobenzaprine (FLEXERIL) 5 MG tablet Take by mouth. Patient not taking: Reported on 12/04/2023 06/07/20   [provider]  glucose blood (ONETOUCH ULTRA TEST) test strip Use to check blood sugar daily 12/05/20   [provider]  glucose blood (ONETOUCH ULTRA TEST) test strip Use to check blood sugar daily 12/05/20   [provider]  hydrochlorothiazide (HYDRODIURIL) 25 MG tablet Take 1 tablet by mouth daily. Patient not taking: Reported on 12/04/2023 02/01/21   [provider]  lidocaine (LIDODERM) 5 % Apply patch to painful area. Patch may remain in place for up to 12 hours in a 24 hour period. Patient not taking: Reported on 12/04/2023 12/19/20   [provider]  meloxicam (MOBIC)  15 MG tablet Take by mouth. Patient not taking: Reported on 12/04/2023 06/07/20   [provider]  metoprolol succinate (TOPROL-XL) 50 MG 24 hr tablet Take by mouth. Patient not taking: Reported on 12/04/2023 08/03/20   [provider]  PERMETHRIN EX Glucometer starter kit. Check fasting blood sugars daily. Dx. Elevated blood sugars R73.9 Patient not taking: Reported on 12/04/2023 10/08/20   [provider]  predniSONE (DELTASONE) 20 MG tablet Take 2 tablets (40 mg total) by mouth daily with breakfast for 5 days. 12/04/23 12/09/23 Yes Tomi Bamberger, PA-C  sildenafil (REVATIO) 20 MG tablet 1-5 tablets prn Orally for 30 days 02/16/23   [provider]  acetaminophen (TYLENOL) 325 MG tablet Take 2 tablets (650 mg total) by mouth every 6 (six) hours as needed for mild pain (or Fever >/= 101). Patient not taking: Reported on 03/15/2016 03/12/16   Lanney Gins, PA-C  amLODipine (NORVASC) 10 MG tablet 1 tablet Orally Once a day for 90 days   Yes [provider]  aspirin 81 MG chewable tablet Chew by mouth daily. Patient not taking: Reported on 12/04/2023    [provider]  Aspirin-Acetaminophen-Caffeine (EXCEDRIN EXTRA STRENGTH PO) Take by mouth. Patient not taking: Reported on 12/04/2023    [provider]  aspirin-acetaminophen-caffeine (EXCEDRIN MIGRAINE) [phone] MG tablet 2 tablets Orally as needed Patient not taking: Reported on 12/04/2023    [provider]  atorvastatin (LIPITOR) 20 MG tablet Take 20 mg by mouth daily.   Yes [provider]  docusate sodium (COLACE) 100 MG capsule Take 1 capsule (100 mg total) by mouth 2 (two) times daily. Patient not taking: Reported on 06/22/2017 03/12/16   Lanney Gins, PA-C  ferrous sulfate 325 (65 FE) MG tablet Take 1 tablet (325 mg total) by mouth 3 (three) times daily after meals. Patient not taking: Reported on 06/22/2017 03/12/16   Lanney Gins, PA-C   hydrochlorothiazide (HYDRODIURIL) 25 MG tablet Take 25 mg by mouth daily. Patient not taking: Reported on 12/04/2023    [provider]  HYDROmorphone (DILAUDID) 2 MG tablet Take 2-4 mg by mouth every 4 (four) hours as needed for moderate pain or severe pain. Patient not taking: Reported on 12/04/2023    [provider]  metFORMIN (GLUCOPHAGE) 500 MG tablet 1 tablet with a meal Orally Once a day for 90 days   Yes [provider]  methocarbamol (ROBAXIN) 500 MG tablet Take 1 tablet (500 mg total) by mouth every 6 (six) hours as needed for muscle spasms. Patient not taking: Reported on 06/22/2017 03/12/16   Lanney Gins, PA-C  metoprolol succinate (TOPROL-XL) 100 MG 24 hr tablet Take 100 mg by mouth daily. Take with or immediately following a meal. Patient not taking: Reported on 12/04/2023    [provider]  Multiple Vitamin (ONE-DAILY MULTI-VITAMIN) TABS Take 1 tablet by mouth daily.   Yes [provider]  naproxen sodium (ANAPROX) 220 MG tablet Take 440 mg by mouth every 6 (six) hours as needed (pain). Patient not taking: Reported on 12/04/2023    [provider]  oxyCODONE (OXY IR/ROXICODONE) 5 MG immediate release tablet Take 1-3 tablets (5-15 mg total) by mouth every 4 (four) hours as needed for severe pain. Patient not taking: Reported on 03/15/2016 03/12/16   Lanney Gins, PA-C  polyethylene glycol (MIRALAX / Ethelene Hal) packet Take 17 g by mouth 2 (two) times daily. Patient not taking: Reported on 03/15/2016 03/12/16   Lanney Gins, PA-C  zolpidem (AMBIEN) 10 MG tablet Take 10 mg by mouth at bedtime as needed for sleep.    [provider]    Family History History reviewed. No pertinent family history.  Social History Social History   Tobacco Use   Smoking status: Former   Smokeless tobacco: Current    Types: Snuff  Vaping Use   Vaping status: Never Used  Substance Use Topics   Alcohol use: Yes    Comment:  occasionally to daily (beer)   Drug use: No     Allergies   Ace inhibitors, Hydrocodone-acetaminophen, and Oxycodone   Review of Systems Review of Systems  Constitutional:  Negative for chills and fever.  HENT:  Negative for congestion, ear pain and sore throat.   Eyes:  Negative for discharge and redness.  Respiratory:  Positive for cough. Negative for shortness of breath.   Gastrointestinal:  Negative for abdominal pain, nausea and vomiting.     Physical Exam Triage Vital Signs ED Triage Vitals [12/04/23 0859]  Encounter Vitals Group     BP (!) 154/95     Systolic BP Percentile      Diastolic BP Percentile      Pulse Rate 94     Resp 18     Temp 99 F (37.2 C)     Temp Source Oral     SpO2 95 %     Weight      Height      Head Circumference      Peak Flow      Pain Score 0     Pain Loc      Pain Education      Exclude from Growth Chart    No data found.  Updated Vital Signs BP (!) 154/95 (BP Location: Right Arm)   Pulse 94   Temp 99 F (37.2 C) (Oral)   Resp 18   SpO2 95%   Visual Acuity Right Eye Distance:   Left Eye Distance:   Bilateral Distance:    Right Eye Near:   Left Eye Near:    Bilateral Near:     Physical Exam Vitals and nursing note reviewed.  Constitutional:      General: He is not in acute distress.    Appearance: Normal appearance. He is not ill-appearing.  HENT:     Head: Normocephalic and atraumatic.     Nose: Nose normal. No congestion.     Mouth/Throat:     Mouth: Mucous membranes are moist.     Pharynx: Oropharynx is clear. No oropharyngeal exudate or posterior oropharyngeal erythema.  Eyes:     Conjunctiva/sclera: Conjunctivae normal.  Cardiovascular:     Rate and Rhythm: Normal rate and regular rhythm.     Heart sounds: Normal heart sounds. No murmur heard. Pulmonary:     Effort: Pulmonary effort is normal. No respiratory distress.     Breath sounds: Normal breath sounds.  No wheezing, rhonchi or rales.      Comments: Deep breathing produces cough Skin:    General: Skin is warm and dry.  Neurological:     Mental Status: He is alert.  Psychiatric:        Mood and Affect: Mood normal.        Thought Content: Thought content normal.      UC Treatments / Results  Labs (all labs ordered are listed, but only abnormal results are displayed) Labs Reviewed - No data to display  EKG   Radiology No results found.  Procedures Procedures (including critical care time)  Medications Ordered in UC Medications - No data to display  Initial Impression / Assessment and Plan / UC Course  I have reviewed the triage vital signs and the nursing notes.  Pertinent labs & imaging results that were available during my care of the patient were reviewed by me and considered in my medical decision making (see chart for details).    Suspect likely bronchitis and will treat with steroid burst as well as Z-Pak to cover possible atypical pneumonia as well.  We are unable to provide x-ray in office today.  Recommended further evaluation if no gradual improvement with any worsening symptoms.  Patient expresses understanding.  Final Clinical Impressions(s) / UC Diagnoses   Final diagnoses:  Acute bronchitis, unspecified organism     Discharge Instructions       Do not take colchicine while taking azithromycin.   Follow up if no improvement or with any worsening symptoms.   ED Prescriptions     Medication Sig Dispense Auth. Provider   predniSONE (DELTASONE) 20 MG tablet Take 2 tablets (40 mg total) by mouth daily with breakfast for 5 days. 10 tablet Erma Pinto F, PA-C   azithromycin (ZITHROMAX) 250 MG tablet Take 1 tablet (250 mg total) by mouth daily. Take first 2 tablets together, then 1 every day until finished. 6 tablet Tomi Bamberger, PA-C      PDMP not reviewed this encounter.   Tomi Bamberger, PA-C 12/04/23 [phone]

## 2023-12-04 NOTE — ED Triage Notes (Signed)
Pt reports nagging, productive cough x2 weeks with postnasal drip. No other symptoms states he doesn't feel bad just cannot get rid of cough - affecting his sleep currently. Denies fevers at home. Only using cough drops at home for relief.

## 2023-12-04 NOTE — Discharge Instructions (Signed)
  Do not take colchicine while taking azithromycin.   Follow up if no improvement or with any worsening symptoms.

## 2023-12-05 ENCOUNTER — Ambulatory Visit
Admission: EM | Admit: 2023-12-05 | Discharge: 2023-12-05 | Discharge status: Against Medical Advice | Payer: Commercial Managed Care - HMO

## 2023-12-05 NOTE — ED Triage Notes (Signed)
Patient dropped by to discuss "Cough" and would like a "quicker treatment" due to upcoming trip. We discussed in detail the following:   Initial Impression / Assessment and Plan / UC Course  I have reviewed the triage vital signs and the nursing notes.   Pertinent labs & imaging results that were available during my care of the patient were reviewed by me and considered in my medical decision making (see chart for details).   Suspect likely bronchitis and will treat with steroid burst as well as Z-Pak to cover possible atypical pneumonia as well.  We are unable to provide x-ray in office today.  Recommended further evaluation if no gradual improvement with any worsening symptoms.  Patient expresses understanding.   Final Clinical Impressions(s) / UC Diagnoses    Final diagnoses:  Acute bronchitis, unspecified organism       Discharge Instructions          Do not take colchicine while taking azithromycin.    Follow up if no improvement or with any worsening symptoms.   ED Prescriptions       Medication Sig Dispense Auth. Provider    predniSONE (DELTASONE) 20 MG tablet Take 2 tablets (40 mg total) by mouth daily with breakfast for 5 days. 10 tablet Erma Pinto F, PA-C    azithromycin (ZITHROMAX) 250 MG tablet Take 1 tablet (250 mg total) by mouth daily. Take first 2 tablets together, then 1 every day until finished. 6 tablet Tomi Bamberger, PA-C         PDMP not reviewed this encounter.   Tomi Bamberger, PA-C 12/04/23 7829  He will keep with current treatment and will use Virtual [facility] in no improvement in 48-72 hrs or concerns.   Yancey Flemings CMA

## 2024-11-29 ENCOUNTER — Other Ambulatory Visit (HOSPITAL_COMMUNITY)
Admission: RE | Admit: 2024-11-29 | Discharge: 2024-11-29 | Disposition: A | Discharge status: Home / Self Care | Source: Ambulatory Visit | Attending: Otolaryngology | Admitting: Otolaryngology

## 2024-11-29 ENCOUNTER — Encounter (INDEPENDENT_AMBULATORY_CARE_PROVIDER_SITE_OTHER): Payer: Self-pay | Admitting: Otolaryngology

## 2024-11-29 ENCOUNTER — Ambulatory Visit (INDEPENDENT_AMBULATORY_CARE_PROVIDER_SITE_OTHER): Admitting: Otolaryngology

## 2024-11-29 VITALS — BP 160/94 | HR 95 | Ht 72.0 in | Wt 250.0 lb

## 2024-11-29 DIAGNOSIS — K130 Diseases of lips: Secondary | ICD-10-CM | POA: Insufficient documentation

## 2024-11-29 DIAGNOSIS — Z87891 Personal history of nicotine dependence: Secondary | ICD-10-CM | POA: Diagnosis not present

## 2024-11-29 NOTE — Progress Notes (Signed)
 Reason for Consult: Lip lesion Referring Physician: Dr. Arloa Kiki Gary Aguilar Gary Aguilar is an 64 y.o. male.  HPI: He has had a lesion on his lip for several months.  Originally he bit it and it got sore and then developed a lesion.  He now bites it periodically.  It hurts when he bites into it.  It has not increased in size.  In between episodes of biting it does not hurt.  He has not had this previously.  Past Medical History:  Diagnosis Date   Acute medial meniscal tear    right    Arthritis    Complication of anesthesia    pt states he had a little trouble waking up from his last surgery   Hypercholesteremia    Hypertension    Sleep apnea 2009   severe osa-uses cpap-   Wears glasses     Past Surgical History:  Procedure Laterality Date   COLONOSCOPY     ELBOW SURGERY Left 2006   tendon repair   KNEE ARTHROSCOPY WITH MEDIAL MENISECTOMY Right 05/30/2015   Procedure: KNEE ARTHROSCOPY WITH TOTAL MEDIAL MENISECTOMY;  Surgeon: Dempsey Sensor, MD;  Location: [facility];  Service: Orthopedics;  Laterality: Right;   NASAL SEPTUM SURGERY  2006   septoplasty-turb reduction   SHOULDER ARTHROSCOPY WITH ROTATOR CUFF REPAIR AND SUBACROMIAL DECOMPRESSION Right 02/23/2014   Procedure: RIGHT SHOULDER ARTHROSCOPY SUBACROMIAL DECOMPRESSION DISTAL CLAVICLE RESECTION ROTATOR CUFF REPAIR ;  Surgeon: Lamar LULLA Leonor Mickey., MD;  Location: [facility];  Service: Orthopedics;  Laterality: Right;   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  Social History:  reports that he has quit smoking. His smokeless tobacco use includes snuff. He reports current alcohol use. He reports that he does not use drugs.  Allergies: Allergies[1]   No results found for this or any previous visit  (from the past 48 hours).  No results found.  ROS Blood pressure (!) 160/94, pulse 95, height 6' (1.829 m), weight 250 lb (113.4 kg), SpO2 94%. Physical Exam Constitutional:      Appearance: Normal appearance.  HENT:     Head: Normocephalic and atraumatic.     Right Ear: Tympanic membrane is without lesions and middle ear aerated, ear canal and external ear normal.     Left Ear: Tympanic membrane is without lesions and middle ear aerated, ear canal and external ear normal.     Nose: Nose without deviation of septum.  Turbinates with mild hypertrophy, No significant swelling or masses.     Oral cavity/oropharynx: There is a small lesion on the right lower lip that is consistent with a fibroma.  It is approximately 4 mm.  Mucous membranes are moist. No lesions or masses    Larynx: normal voice. Mirror attempted without success    Eyes:     Extraocular Movements: Extraocular movements intact.     Conjunctiva/sclera: Conjunctivae normal.     Pupils: Pupils are equal, round, and reactive to light.  Cardiovascular:     Rate and Rhythm: Normal rate.  Pulmonary:     Effort: Pulmonary effort is normal.  Musculoskeletal:     Cervical back: Normal range of motion and neck supple. No rigidity.  Lymphadenopathy:     Cervical: No cervical adenopathy or masses.salivary  glands without lesions. .     Salivary glands- no mass or swelling Neurological:     Mental Status: He is alert. CN 2-12 intact. No nystagmus  Lower lip lesion  We discussed the risk, benefits, and options.  All his questions were answered and consent was obtained.  The lesion was sprayed with Cetacaine and then injected 1% lidocaine  with 1-100,000 epinephrine .  The lesion was removed without difficulty with scissor dissection.  The base was cauterized with silver nitrate.  He tolerated this well.  There was good hemostasis.    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  He will have a regular diet but gargle with saline twice a day for about a week.  He will follow-up to recheck the wound.  Norleen Notice 11/29/2024, 10:30 AM        [1]  Allergies Allergen Reactions   Ace Inhibitors Swelling    *mouth and throat swelling   Hydrocodone-Acetaminophen  Swelling    *mouth and throat swelling   *mouth and throat swelling   Oxycodone  Swelling    *mouth and throat swelling

## 2024-12-01 LAB — SURGICAL PATHOLOGY

## 2024-12-06 ENCOUNTER — Telehealth (INDEPENDENT_AMBULATORY_CARE_PROVIDER_SITE_OTHER): Payer: Self-pay

## 2024-12-06 NOTE — Telephone Encounter (Signed)
 Called patient to let them know their skin tag came back benign. And that Dr. Roark stated that the patient is fine it should be resolved now and to just give us  a call if it comes back or if he has any other issues. Patient understood and stated he was doing great he was able to eat again with no issues.
